# Patient Record
Sex: Female | Born: 2000 | Race: White | Hispanic: No | Marital: Single | Smoking: Never smoker
Health system: Southern US, Community
[De-identification: ages and names within clinical notes are randomized; demographics above are authoritative.]

## PROBLEM LIST (undated history)

## (undated) ENCOUNTER — Emergency Department (HOSPITAL_COMMUNITY): Payer: 59

## (undated) DIAGNOSIS — T50901A Poisoning by unspecified drugs, medicaments and biological substances, accidental (unintentional), initial encounter: Secondary | ICD-10-CM

## (undated) DIAGNOSIS — F32A Depression, unspecified: Secondary | ICD-10-CM

## (undated) DIAGNOSIS — F419 Anxiety disorder, unspecified: Secondary | ICD-10-CM

## (undated) HISTORY — PX: WISDOM TOOTH EXTRACTION: SHX21

## (undated) HISTORY — PX: APPENDECTOMY: SHX54

---

## 2020-04-20 ENCOUNTER — Other Ambulatory Visit: Payer: Self-pay

## 2020-04-20 ENCOUNTER — Emergency Department (HOSPITAL_COMMUNITY)
Admission: EM | Admit: 2020-04-20 | Discharge: 2020-04-21 | Disposition: A | Discharge status: Home / Self Care | Payer: 59 | Attending: Emergency Medicine | Admitting: Emergency Medicine

## 2020-04-20 ENCOUNTER — Emergency Department (HOSPITAL_COMMUNITY): Payer: 59

## 2020-04-20 ENCOUNTER — Encounter (HOSPITAL_COMMUNITY): Payer: Self-pay | Admitting: Emergency Medicine

## 2020-04-20 DIAGNOSIS — R05 Cough: Secondary | ICD-10-CM | POA: Insufficient documentation

## 2020-04-20 DIAGNOSIS — R1084 Generalized abdominal pain: Secondary | ICD-10-CM | POA: Diagnosis not present

## 2020-04-20 DIAGNOSIS — R000 Tachycardia, unspecified: Secondary | ICD-10-CM | POA: Diagnosis not present

## 2020-04-20 DIAGNOSIS — R509 Fever, unspecified: Secondary | ICD-10-CM | POA: Insufficient documentation

## 2020-04-20 DIAGNOSIS — Z20822 Contact with and (suspected) exposure to covid-19: Secondary | ICD-10-CM | POA: Insufficient documentation

## 2020-04-20 DIAGNOSIS — I880 Nonspecific mesenteric lymphadenitis: Secondary | ICD-10-CM

## 2020-04-20 LAB — COMPREHENSIVE METABOLIC PANEL
ALT: 59 U/L — ABNORMAL HIGH (ref 0–44)
AST: 56 U/L — ABNORMAL HIGH (ref 15–41)
Albumin: 4.4 g/dL (ref 3.5–5.0)
Alkaline Phosphatase: 70 U/L (ref 38–126)
Anion gap: 12 (ref 5–15)
BUN: 9 mg/dL (ref 6–20)
CO2: 24 mmol/L (ref 22–32)
Calcium: 9.5 mg/dL (ref 8.9–10.3)
Chloride: 102 mmol/L (ref 98–111)
Creatinine, Ser: 0.83 mg/dL (ref 0.44–1.00)
GFR calc Af Amer: >60 mL/min (ref >60)
GFR calc non Af Amer: >60 mL/min (ref >60)
Glucose, Bld: 101 mg/dL — ABNORMAL HIGH (ref 70–99)
Potassium: 3.3 mmol/L — ABNORMAL LOW (ref 3.5–5.1)
Sodium: 138 mmol/L (ref 135–145)
Total Bilirubin: 1.5 mg/dL — ABNORMAL HIGH (ref 0.3–1.2)
Total Protein: 7.3 g/dL (ref 6.5–8.1)

## 2020-04-20 LAB — CBC WITH DIFFERENTIAL/PLATELET
Abs Immature Granulocytes: 0.01 10*3/uL (ref 0.00–0.07)
Basophils Absolute: 0 10*3/uL (ref 0.0–0.1)
Basophils Relative: 1 %
Eosinophils Absolute: 0 10*3/uL (ref 0.0–0.5)
Eosinophils Relative: 0 %
HCT: 35.5 % — ABNORMAL LOW (ref 36.0–46.0)
Hemoglobin: 12.3 g/dL (ref 12.0–15.0)
Immature Granulocytes: 0 %
Lymphocytes Relative: 38 %
Lymphs Abs: 2.1 10*3/uL (ref 0.7–4.0)
MCH: 30.1 pg (ref 26.0–34.0)
MCHC: 34.6 g/dL (ref 30.0–36.0)
MCV: 86.8 fL (ref 80.0–100.0)
Monocytes Absolute: 0.4 10*3/uL (ref 0.1–1.0)
Monocytes Relative: 7 %
Neutro Abs: 3.1 10*3/uL (ref 1.7–7.7)
Neutrophils Relative %: 54 %
Platelets: 186 10*3/uL (ref 150–400)
RBC: 4.09 MIL/uL (ref 3.87–5.11)
RDW: 12.9 % (ref 11.5–15.5)
WBC: 5.7 10*3/uL (ref 4.0–10.5)
nRBC: 0 % (ref 0.0–0.2)

## 2020-04-20 LAB — I-STAT BETA HCG BLOOD, ED (MC, WL, AP ONLY): I-stat hCG, quantitative: <5 m[IU]/mL (ref <5)

## 2020-04-20 LAB — LACTIC ACID, PLASMA: Lactic Acid, Venous: 1 mmol/L (ref 0.5–1.9)

## 2020-04-20 MED ORDER — ACETAMINOPHEN 325 MG PO TABS
650.0000 mg | ORAL_TABLET | Freq: Once | ORAL | Status: AC
  Administered 2020-04-20: 650 mg via ORAL
  Filled 2020-04-20: qty 2

## 2020-04-20 MED ORDER — LACTATED RINGERS IV BOLUS (SEPSIS)
1000.0000 mL | Freq: Once | INTRAVENOUS | Status: AC
  Administered 2020-04-20: 1000 mL via INTRAVENOUS

## 2020-04-20 MED ORDER — METRONIDAZOLE IN NACL 5-0.79 MG/ML-% IV SOLN
500.0000 mg | Freq: Once | INTRAVENOUS | Status: AC
  Administered 2020-04-21: 500 mg via INTRAVENOUS
  Filled 2020-04-20: qty 100

## 2020-04-20 MED ORDER — LACTATED RINGERS IV BOLUS (SEPSIS)
250.0000 mL | Freq: Once | INTRAVENOUS | Status: AC
  Administered 2020-04-20: 250 mL via INTRAVENOUS

## 2020-04-20 MED ORDER — FENTANYL CITRATE (PF) 100 MCG/2ML IJ SOLN
50.0000 ug | Freq: Once | INTRAMUSCULAR | Status: AC
  Administered 2020-04-20: 50 ug via INTRAVENOUS
  Filled 2020-04-20: qty 2

## 2020-04-20 MED ORDER — LACTATED RINGERS IV SOLN: INTRAVENOUS | Status: DC

## 2020-04-20 MED ORDER — IOHEXOL 300 MG/ML  SOLN
100.0000 mL | Freq: Once | INTRAMUSCULAR | Status: AC | PRN
  Administered 2020-04-20: 100 mL via INTRAVENOUS

## 2020-04-20 MED ORDER — SODIUM CHLORIDE 0.9 % IV SOLN
2.0000 g | Freq: Once | INTRAVENOUS | Status: AC
  Administered 2020-04-20: 2 g via INTRAVENOUS
  Filled 2020-04-20: qty 20

## 2020-04-20 NOTE — ED Triage Notes (Signed)
Patient c/o fever, body aches, and abdominal pain x2 days.

## 2020-04-20 NOTE — ED Provider Notes (Signed)
[facility]-EMERGENCY DEPT Provider Note   CSN: [id number] Arrival date & time: 04/20/20  2218     History Chief Complaint  Patient presents with  . Abdominal Pain  . Fever    Cathy Garner is a 19 y.o. female.  The history is provided by the patient.  Abdominal Pain Pain location:  Generalized Pain quality: aching   Pain radiates to:  Does not radiate Pain severity:  Severe Onset quality:  Gradual Duration:  2 days Timing:  Constant Progression:  Worsening Chronicity:  New Relieved by:  None tried Worsened by:  Movement and palpation Associated symptoms: chills, cough and fever   Associated symptoms: no diarrhea, no dysuria, no vaginal bleeding, no vaginal discharge and no vomiting   Fever Associated symptoms: chills and cough   Associated symptoms: no diarrhea, no dysuria and no vomiting    Patient reports over the past 2 days she has had abdominal pain.  She reports that starting on her right side is now throughout her abdomen.  She reports fevers and body aches.  She has had mild cough.  She is a Archivist.  She is fully vaccinated for Covid She had a previous appendectomy     PMH- Past Surgical History:  Procedure Laterality Date  . APPENDECTOMY       OB History   No obstetric history on file.     No family history on file.  Social History   Tobacco Use  . Smoking status: Not on file  Substance Use Topics  . Alcohol use: Not on file  . Drug use: Not on file    Home Medications Prior to Admission medications   Not on File    Allergies    Patient has no known allergies.  Review of Systems   Review of Systems  Constitutional: Positive for chills and fever.  Respiratory: Positive for cough.   Gastrointestinal: Positive for abdominal pain. Negative for diarrhea and vomiting.  Genitourinary: Negative for dysuria, vaginal bleeding and vaginal discharge.  All other systems reviewed and are negative.   Physical  Exam Updated Vital Signs BP (!) 135/100 (BP Location: Left Arm)   Pulse (!) 135   Temp (!) 102.4 F (39.1 C) (Oral)   Resp 18   Ht 1.727 m (5\' 8" )   Wt 72.6 kg   LMP 04/06/2020   SpO2 100%   BMI 24.33 kg/m   Physical Exam CONSTITUTIONAL: Well developed/well nourished, uncomfortable appearing  HEAD: Normocephalic/atraumatic EYES: EOMI/PERRL ENMT: mask in place NECK: supple no meningeal signs SPINE/BACK:entire spine nontender CV: S1/S2 noted, no murmurs/rubs/gallops noted LUNGS: Lungs are clear to auscultation bilaterally, no apparent distress ABDOMEN: soft, diffuse moderate tenderness, no rebound or guarding, bowel sounds noted throughout abdomen GU:no cva tenderness NEURO: Pt is awake/alert/appropriate, moves all extremitiesx4.  No facial droop.   EXTREMITIES: pulses normal/equal, full ROM SKIN: warm, color normal PSYCH: no abnormalities of mood noted, alert and oriented to situation  ED Results / Procedures / Treatments   Labs (all labs ordered are listed, but only abnormal results are displayed) Labs Reviewed  CBC WITH DIFFERENTIAL/PLATELET - Abnormal; Notable for the following components:      Result Value   HCT 35.5 (*)    All other components within normal limits  SARS CORONAVIRUS 2 BY RT PCR (HOSPITAL ORDER, PERFORMED IN [facility])  CULTURE, BLOOD (ROUTINE X 2)  CULTURE, BLOOD (ROUTINE X 2)  URINE CULTURE  LACTIC ACID, PLASMA  LACTIC ACID, PLASMA  COMPREHENSIVE METABOLIC PANEL  URINALYSIS, ROUTINE W REFLEX MICROSCOPIC  I-STAT BETA HCG BLOOD, ED ([facility], [facility], [facility] ONLY)    EKG EKG Interpretation  Date/Time:  Tuesday April 20 2020 23:25:01 EDT Ventricular Rate:  118 PR Interval:    QRS Duration: 85 QT Interval:  311 QTC Calculation: 436 R Axis:   82 Text Interpretation: Sinus tachycardia Probable left atrial enlargement No previous ECGs available Confirmed by Zadie Rhine ([id number]) on 04/20/2020 11:30:11 PM   Radiology DG Chest Port 1  View  Result Date: 04/20/2020 CLINICAL DATA:  Fever, weakness and body aches. EXAM: PORTABLE CHEST 1 VIEW COMPARISON:  None. FINDINGS: The heart size and mediastinal contours are within normal limits. Both lungs are clear. The visualized skeletal structures are unremarkable. IMPRESSION: No active disease. Electronically Signed   By: Aram Candela M.D.   On: 04/20/2020 23:28    Procedures .Critical Care Performed by: Zadie Rhine, MD Authorized by: Zadie Rhine, MD   Critical care provider statement:    Critical care time (minutes):  35   Critical care start time:  04/20/2020 10:55 PM   Critical care end time:  04/20/2020 10:30 PM   Critical care time was exclusive of:  Separately billable procedures and treating other patients   Critical care was necessary to treat or prevent imminent or life-threatening deterioration of the following conditions:  Dehydration and sepsis   Critical care was time spent personally by me on the following activities:  Development of treatment plan with patient or surrogate, evaluation of patient's response to treatment, examination of patient, pulse oximetry, ordering and review of radiographic studies, ordering and review of laboratory studies, re-evaluation of patient's condition and ordering and performing treatments and interventions   I assumed direction of critical care for this patient from another provider in my specialty: no      Medications Ordered in ED Medications  lactated ringers infusion (has no administration in time range)  lactated ringers bolus 1,000 mL (has no administration in time range)    And  lactated ringers bolus 1,000 mL (has no administration in time range)    And  lactated ringers bolus 250 mL (has no administration in time range)  cefTRIAXone (ROCEPHIN) 2 g in sodium chloride 0.9 % 100 mL IVPB (has no administration in time range)  metroNIDAZOLE (FLAGYL) IVPB 500 mg (has no administration in time range)  fentaNYL  (SUBLIMAZE) injection 50 mcg (has no administration in time range)  acetaminophen (TYLENOL) tablet 650 mg (has no administration in time range)    ED Course  I have reviewed the triage vital signs and the nursing notes.  Pertinent labs & imaging results that were available during my care of the patient were reviewed by me and considered in my medical decision making (see chart for details).    MDM Rules/Calculators/A&P                          11:15 PM Presents with fever/abdominal pain for 2 days.  Patient is ill-appearing.  Code sepsis has been called.  Her request I have contacted her mother and her college friend and informed them of the plan.  We will follow closely 11:38 PM CXR reviewed - this was negative Pt receiving IV fluids She reports mild improvement Will proceed with CT imaging abd/pelvis due to concern for sepsis IV antibiotics have been ordered At signout to Dr. Eudelia Bunch, f/u on CT imaging  Final Clinical Impression(s) / ED Diagnoses Final diagnoses:  Generalized abdominal pain  Tachycardia    Rx / DC Orders ED Discharge Orders    None       Zadie Rhine, MD 04/20/20 2339

## 2020-04-21 ENCOUNTER — Emergency Department (HOSPITAL_COMMUNITY): Payer: 59

## 2020-04-21 LAB — URINALYSIS, ROUTINE W REFLEX MICROSCOPIC
Bilirubin Urine: NEGATIVE
Glucose, UA: NEGATIVE mg/dL
Hgb urine dipstick: NEGATIVE
Ketones, ur: 5 mg/dL — AB
Leukocytes,Ua: NEGATIVE
Nitrite: NEGATIVE
Protein, ur: NEGATIVE mg/dL
Specific Gravity, Urine: 1.032 — ABNORMAL HIGH (ref 1.005–1.030)
pH: 5 (ref 5.0–8.0)

## 2020-04-21 LAB — SARS CORONAVIRUS 2 BY RT PCR (HOSPITAL ORDER, PERFORMED IN ~~LOC~~ HOSPITAL LAB): SARS Coronavirus 2: NEGATIVE

## 2020-04-21 LAB — URINE CULTURE: Culture: NO GROWTH

## 2020-04-21 NOTE — ED Provider Notes (Signed)
I assumed care of this patient.  Please see previous provider note for further details of Hx, PE.  Briefly patient is a 19 y.o. female who presented fever and abdominal pain.  Labs grossly reassuring.  Patient pending CT of the abdomen.    CT notable for shotty mesenteric lymphadenopathy with mild splenomegaly.  Given fever likely infectious.  Patient denies any throat pain related to possible mono.  He does however have mild transaminitis.  No pharyngitis on exam.  Informed of the possibility of her having mononucleosis. COVID negative.  Also informed that the other possible differential diagnoses.  Patient symptoms improved after treatment here and she was able to tolerate oral intake.  The patient appears reasonably screened and/or stabilized for discharge and I doubt any other medical condition or other [facility] requiring further screening, evaluation, or treatment in the ED at this time prior to discharge. Safe for discharge with strict return precautions.  Disposition: Discharge  Condition: Good  I have discussed the results, Dx and Tx plan with the patient/family who expressed understanding and agree(s) with the plan. Discharge instructions discussed at length. The patient/family was given strict return precautions who verbalized understanding of the instructions. No further questions at time of discharge.    ED Discharge Orders    None       Follow Up: [facility]  Go to  As needed      Nira Conn, MD 04/21/20 [phone]

## 2020-04-21 NOTE — ED Notes (Signed)
Patient transported to CT 

## 2020-04-21 NOTE — ED Notes (Signed)
Pt ambulatory to restroom w/out assistance.  

## 2020-04-23 ENCOUNTER — Ambulatory Visit
Admission: EM | Admit: 2020-04-23 | Discharge: 2020-04-23 | Disposition: A | Discharge status: Home / Self Care | Payer: 59

## 2020-04-26 LAB — CULTURE, BLOOD (ROUTINE X 2)
Culture: NO GROWTH
Culture: NO GROWTH
Special Requests: ADEQUATE
Special Requests: ADEQUATE

## 2020-09-20 ENCOUNTER — Other Ambulatory Visit: Payer: Self-pay

## 2020-09-20 DIAGNOSIS — T43201A Poisoning by unspecified antidepressants, accidental (unintentional), initial encounter: Secondary | ICD-10-CM | POA: Insufficient documentation

## 2020-09-20 DIAGNOSIS — R45851 Suicidal ideations: Secondary | ICD-10-CM | POA: Diagnosis not present

## 2020-09-20 DIAGNOSIS — F332 Major depressive disorder, recurrent severe without psychotic features: Secondary | ICD-10-CM | POA: Insufficient documentation

## 2020-09-20 DIAGNOSIS — Z20822 Contact with and (suspected) exposure to covid-19: Secondary | ICD-10-CM | POA: Insufficient documentation

## 2020-09-20 DIAGNOSIS — Z046 Encounter for general psychiatric examination, requested by authority: Secondary | ICD-10-CM | POA: Diagnosis present

## 2020-09-21 ENCOUNTER — Emergency Department
Admission: EM | Admit: 2020-09-21 | Discharge: 2020-09-22 | Disposition: A | Discharge status: Other Acute Inpatient Hospital | Payer: 59 | Attending: Emergency Medicine | Admitting: Emergency Medicine

## 2020-09-21 ENCOUNTER — Encounter: Payer: Self-pay | Admitting: Emergency Medicine

## 2020-09-21 DIAGNOSIS — F332 Major depressive disorder, recurrent severe without psychotic features: Secondary | ICD-10-CM | POA: Diagnosis present

## 2020-09-21 DIAGNOSIS — T43211A Poisoning by selective serotonin and norepinephrine reuptake inhibitors, accidental (unintentional), initial encounter: Secondary | ICD-10-CM

## 2020-09-21 DIAGNOSIS — R45851 Suicidal ideations: Secondary | ICD-10-CM

## 2020-09-21 DIAGNOSIS — T43201A Poisoning by unspecified antidepressants, accidental (unintentional), initial encounter: Secondary | ICD-10-CM

## 2020-09-21 HISTORY — DX: Anxiety disorder, unspecified: F41.9

## 2020-09-21 HISTORY — DX: Depression, unspecified: F32.A

## 2020-09-21 LAB — CBC
HCT: 36 % (ref 36.0–46.0)
Hemoglobin: 12.1 g/dL (ref 12.0–15.0)
MCH: 29.2 pg (ref 26.0–34.0)
MCHC: 33.6 g/dL (ref 30.0–36.0)
MCV: 86.7 fL (ref 80.0–100.0)
Platelets: 226 10*3/uL (ref 150–400)
RBC: 4.15 MIL/uL (ref 3.87–5.11)
RDW: 13.1 % (ref 11.5–15.5)
WBC: 6.1 10*3/uL (ref 4.0–10.5)
nRBC: 0 % (ref 0.0–0.2)

## 2020-09-21 LAB — COMPREHENSIVE METABOLIC PANEL
ALT: 18 U/L (ref 0–44)
AST: 16 U/L (ref 15–41)
Albumin: 4.4 g/dL (ref 3.5–5.0)
Alkaline Phosphatase: 57 U/L (ref 38–126)
Anion gap: 7 (ref 5–15)
BUN: 9 mg/dL (ref 6–20)
CO2: 22 mmol/L (ref 22–32)
Calcium: 9.3 mg/dL (ref 8.9–10.3)
Chloride: 108 mmol/L (ref 98–111)
Creatinine, Ser: 0.59 mg/dL (ref 0.44–1.00)
GFR, Estimated: >60 mL/min (ref >60)
Glucose, Bld: 113 mg/dL — ABNORMAL HIGH (ref 70–99)
Potassium: 3.4 mmol/L — ABNORMAL LOW (ref 3.5–5.1)
Sodium: 137 mmol/L (ref 135–145)
Total Bilirubin: 0.9 mg/dL (ref 0.3–1.2)
Total Protein: 7.2 g/dL (ref 6.5–8.1)

## 2020-09-21 LAB — RESP PANEL BY RT-PCR (FLU A&B, COVID) ARPGX2
Influenza A by PCR: NEGATIVE
Influenza B by PCR: NEGATIVE
SARS Coronavirus 2 by RT PCR: NEGATIVE

## 2020-09-21 LAB — ETHANOL: Alcohol, Ethyl (B): <10 mg/dL (ref <10)

## 2020-09-21 LAB — URINE DRUG SCREEN, QUALITATIVE (ARMC ONLY)
Amphetamines, Ur Screen: NOT DETECTED
Barbiturates, Ur Screen: NOT DETECTED
Benzodiazepine, Ur Scrn: NOT DETECTED
Cannabinoid 50 Ng, Ur ~~LOC~~: POSITIVE — AB
Cocaine Metabolite,Ur ~~LOC~~: NOT DETECTED
MDMA (Ecstasy)Ur Screen: NOT DETECTED
Methadone Scn, Ur: NOT DETECTED
Opiate, Ur Screen: NOT DETECTED
Phencyclidine (PCP) Ur S: NOT DETECTED
Tricyclic, Ur Screen: NOT DETECTED

## 2020-09-21 LAB — ACETAMINOPHEN LEVEL: Acetaminophen (Tylenol), Serum: <10 ug/mL — ABNORMAL LOW (ref 10–30)

## 2020-09-21 LAB — SALICYLATE LEVEL: Salicylate Lvl: <7 mg/dL — ABNORMAL LOW (ref 7.0–30.0)

## 2020-09-21 LAB — POC URINE PREG, ED: Preg Test, Ur: NEGATIVE

## 2020-09-21 MED ORDER — TRAZODONE HCL 50 MG PO TABS: 50.0000 mg | ORAL_TABLET | Freq: Every evening | ORAL | Status: DC | PRN

## 2020-09-21 MED ORDER — FLUOXETINE HCL 20 MG PO CAPS
40.0000 mg | ORAL_CAPSULE | Freq: Every day | ORAL | Status: DC
  Administered 2020-09-21: 40 mg via ORAL
  Filled 2020-09-21: qty 2

## 2020-09-21 MED ORDER — IBUPROFEN 600 MG PO TABS
600.0000 mg | ORAL_TABLET | Freq: Once | ORAL | Status: AC
  Administered 2020-09-21: 600 mg via ORAL
  Filled 2020-09-21: qty 1

## 2020-09-21 NOTE — ED Notes (Signed)
Hourly rounding completed at this time, patient currently awake in room. No complaints, stable, and in no acute distress. Q15 minute rounds and monitoring via Security Cameras to continue. 

## 2020-09-21 NOTE — Consult Note (Signed)
[facility] Face-to-Face Psychiatry Consult   Reason for Consult: Ingestion and Suicidal Referring Physician: Dr. Katrinka Blazing Patient Identification: Cathy Garner MRN:  [id number] Principal Diagnosis: <principal problem not specified> Diagnosis:  Active Problems:   MDD (major depressive disorder), recurrent episode, severe (HCC)   Total Time spent with patient: 30 minutes  Subjective: "I became overwhelmed and took about 5-6 Trazadone."  [name] is a 20 y.o. female patient presented to [facility] ED under involuntary commitment status (IVC). Per the ED triage nurse note, Pt reports taking approximately 5 Trazodone since 2130. When asked if this was to harm self, pt responds "yes." Pt reports she is still actively having SI thoughts. Pt with hx/o mental health disorders and in-patient placement. Pt is calm and cooperative in triage. The Pt reports taking approximately 5 Trazodone since 2130. When asked if this was to harm self, pt responds "yes." Pt reports she is still actively having SI thoughts. Pt with hx/o mental health disorders and in-patient placement. The patient is calm and cooperative in triage.   The patient was seen face-to-face by this provider; the chart was reviewed and consulted with Dr. Katrinka Blazing on 09/21/2020 due to the patient's care. It was discussed with the EDP that the patient does meet the criteria to be admitted to the psychiatric inpatient unit.  The patient continued to be matter-of-fact about her action tonight. The patient stated her behavior tonight partially stems from her taking on other people's problems. The patient disclosed she is from [place] and her parents are aware of what happened tonight (02.22.22). 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 The patient admits to having a therapist, and her primary care prescribes her medication. She reports being prescribed Prozac 40 mg daily and has taken this medication for almost a year. The patient voiced  that she does not think the medication has been as effective as she hoped.  The patient discussed she had been hospitalized once before today's visit. She narrated it was last spring, and she had suicidal ideation. She expressed checking herself into the hospital before she would do something worse. She stated she was in the hospital for a week and then partial hospitalization for three weeks. On evaluation, the patient is alert and oriented x 4, calm and cooperative, and mood-congruent with affect. The patient does not appear to be responding to internal or external stimuli. Neither is the patient presenting with any delusional thinking. The patient denies auditory or visual hallucinations. The patient denies any suicidal, homicidal, or self-harm ideations. The patient is not presenting with any psychotic or paranoid behaviors. During an encounter with the patient, she answered questions appropriately.   HPI: per Dr. Katrinka Blazing, Cathy Garner is a 20 y.o. femalewho presents to the ED for evaluation of suicidality and possible overdose. Chart review indicates history of anxiety and depression. Patient is a Consulting civil engineer at [employer].  She denies any recent illnesses.  She reports being prescribed SSRI and trazodone. Patient reports psychosocial stressors precipitating acute suicidality and depression causing her to take 5-6 tablets of her prescribed trazodone 50 mg tablets.  She reports taking extra tablets in order "to fall asleep forever" and with the intent of suicide.  She reports this was at 9:30 PM on 2/21.  She denies taking any additional prescription or recreational drugs this evening beyond the trazodone. She reports continued desire for suicide.  Denies homicidality or hallucinations at this time.   Past Psychiatric History:  Anxiety Depression  Risk to Self:  Yes Risk to Others:  No Prior Inpatient Therapy:  Yes Prior Outpatient Therapy:  Yes  Past Medical History:  Past Medical  History:  Diagnosis Date  . Anxiety   . Depression     Past Surgical History:  Procedure Laterality Date  . APPENDECTOMY    . WISDOM TOOTH EXTRACTION     Family History: History reviewed. No pertinent family history. Family Psychiatric  History:  Social History:  Social History   Substance and Sexual Activity  Alcohol Use Yes   Comment: Occasional Drinker     Social History   Substance and Sexual Activity  Drug Use Never    Social History   Socioeconomic History  . Marital status: Single    Spouse name: Not on file  . Number of children: Not on file  . Years of education: Not on file  . Highest education level: Not on file  Occupational History  . Not on file  Tobacco Use  . Smoking status: Never Smoker  . Smokeless tobacco: Never Used  Substance and Sexual Activity  . Alcohol use: Yes    Comment: Occasional Drinker  . Drug use: Never  . Sexual activity: Not on file  Other Topics Concern  . Not on file  Social History Narrative  . Not on file   Social Determinants of Health   Financial Resource Strain: Not on file  Food Insecurity: Not on file  Transportation Needs: Not on file  Physical Activity: Not on file  Stress: Not on file  Social Connections: Not on file   Additional Social History:    Allergies:   Allergies  Allergen Reactions  . Red Dye Itching    Labs:  Results for orders placed or performed during the hospital encounter of 09/21/20 (from the past 48 hour(s))  Comprehensive metabolic panel     Status: Abnormal   Collection Time: 09/21/20 12:00 AM  Result Value Ref Range   Sodium 137 135 - 145 mmol/L   Potassium 3.4 (L) 3.5 - 5.1 mmol/L   Chloride 108 98 - 111 mmol/L   CO2 22 22 - 32 mmol/L   Glucose, Bld 113 (H) 70 - 99 mg/dL    Comment: Glucose reference range applies only to samples taken after fasting for at least 8 hours.   BUN 9 6 - 20 mg/dL   Creatinine, Ser 6.26 0.44 - 1.00 mg/dL   Calcium 9.3 8.9 - 94.8 mg/dL   Total  Protein 7.2 6.5 - 8.1 g/dL   Albumin 4.4 3.5 - 5.0 g/dL   AST 16 15 - 41 U/L   ALT 18 0 - 44 U/L   Alkaline Phosphatase 57 38 - 126 U/L   Total Bilirubin 0.9 0.3 - 1.2 mg/dL   GFR, Estimated >54 >62 mL/min    Comment: (NOTE) Calculated using the CKD-EPI Creatinine Equation (2021)    Anion gap 7 5 - 15    Comment: Performed at [facility] [place] [facility], [address] [place], [place] [address]  Ethanol     Status: None   Collection Time: 09/21/20 12:00 AM  Result Value Ref Range   Alcohol, Ethyl (B) <10 <10 mg/dL    Comment: (NOTE) Lowest detectable limit for serum alcohol is 10 mg/dL.  For medical purposes only. Performed at [facility], [address] [place], [place] [address]   Salicylate level     Status: Abnormal   Collection Time: 09/21/20 12:00 AM  Result Value Ref Range   Salicylate Lvl <7.0 (  L) 7.0 - 30.0 mg/dL    Comment: Performed at [facility], [address] [place], [place] [address]  Acetaminophen level     Status: Abnormal   Collection Time: 09/21/20 12:00 AM  Result Value Ref Range   Acetaminophen (Tylenol), Serum <10 (L) 10 - 30 ug/mL    Comment: (NOTE) Therapeutic concentrations vary significantly. A range of 10-30 ug/mL  may be an effective concentration for many patients. However, some  are best treated at concentrations outside of this range. Acetaminophen concentrations >150 ug/mL at 4 hours after ingestion  and >50 ug/mL at 12 hours after ingestion are often associated with  toxic reactions.  Performed at [facility], [address] [place], [place] [address]   cbc     Status: None   Collection Time: 09/21/20 12:00 AM  Result Value Ref Range   WBC 6.1 4.0 - 10.5 K/uL   RBC 4.15 3.87 - 5.11 MIL/uL   Hemoglobin 12.1 12.0 - 15.0 g/dL   HCT 09.836.0 11.936.0 - 14.746.0 %   MCV 86.7 80.0 - 100.0 fL   MCH 29.2 26.0 - 34.0 pg   MCHC 33.6 30.0 - 36.0 g/dL   RDW 82.913.1 56.211.5 - 13.015.5 %   Platelets 226 150 - 400 K/uL   nRBC 0.0  0.0 - 0.2 %    Comment: Performed at [facility], [address] [place], [place] [address]  Urine Drug Screen, Qualitative     Status: Abnormal   Collection Time: 09/21/20 12:00 AM  Result Value Ref Range   Tricyclic, Ur Screen NONE DETECTED NONE DETECTED   Amphetamines, Ur Screen NONE DETECTED NONE DETECTED   MDMA (Ecstasy)Ur Screen NONE DETECTED NONE DETECTED   Cocaine Metabolite,Ur [place] NONE DETECTED NONE DETECTED   Opiate, Ur Screen NONE DETECTED NONE DETECTED   Phencyclidine (PCP) Ur S NONE DETECTED NONE DETECTED   Cannabinoid 50 Ng, Ur [place] POSITIVE (A) NONE DETECTED   Barbiturates, Ur Screen NONE DETECTED NONE DETECTED   Benzodiazepine, Ur Scrn NONE DETECTED NONE DETECTED   Methadone Scn, Ur NONE DETECTED NONE DETECTED    Comment: (NOTE) Tricyclics + metabolites, urine    Cutoff 1000 ng/mL Amphetamines + metabolites, urine  Cutoff 1000 ng/mL MDMA (Ecstasy), urine              Cutoff 500 ng/mL Cocaine Metabolite, urine          Cutoff 300 ng/mL Opiate + metabolites, urine        Cutoff 300 ng/mL Phencyclidine (PCP), urine         Cutoff 25 ng/mL Cannabinoid, urine                 Cutoff 50 ng/mL Barbiturates + metabolites, urine  Cutoff 200 ng/mL Benzodiazepine, urine              Cutoff 200 ng/mL Methadone, urine                   Cutoff 300 ng/mL  The urine drug screen provides only a preliminary, unconfirmed analytical test result and should not be used for non-medical purposes. Clinical consideration and professional judgment should be applied to any positive drug screen result due to possible interfering substances. A more specific alternate chemical method must be used in order to obtain a confirmed analytical result. Gas chromatography / mass spectrometry (GC/MS) is the preferred confirm atory method. Performed at [facility], [address] [place], [place] [address]   POC urine  preg, ED     Status: None   Collection Time: 09/21/20 12:06 AM   Result Value Ref Range   Preg Test, Ur NEGATIVE NEGATIVE    Comment:        THE SENSITIVITY OF THIS METHODOLOGY IS >24 mIU/mL     No current facility-administered medications for this encounter.   Current Outpatient Medications  Medication Sig Dispense Refill  . acetaminophen (TYLENOL) 325 MG tablet Take 650 mg by mouth every 6 (six) hours as needed for mild pain, moderate pain, fever or headache.    Marland Kitchen FLUoxetine (PROZAC) 40 MG capsule Take 40 mg by mouth daily.    . traZODone (DESYREL) 50 MG tablet Take 75 mg by mouth at bedtime as needed for sleep.     Marland Kitchen triamcinolone (KENALOG) 0.025 % ointment Apply 1 application topically 2 (two) times daily as needed. Itching      Musculoskeletal: Strength & Muscle Tone: within normal limits Gait & Station: normal Patient leans: N/A  Psychiatric Specialty Exam: Physical Exam Vitals and nursing note reviewed.  Constitutional:      Appearance: Normal appearance.  HENT:     Right Ear: External ear normal.     Left Ear: External ear normal.  Cardiovascular:     Rate and Rhythm: Normal rate.     Pulses: Normal pulses.  Pulmonary:     Effort: Pulmonary effort is normal.  Musculoskeletal:        General: Normal range of motion.     Cervical back: Normal range of motion and neck supple.  Neurological:     General: No focal deficit present.     Mental Status: She is alert and oriented to person, place, and time. Mental status is at baseline.  Psychiatric:        Attention and Perception: Attention and perception normal.        Mood and Affect: Affect normal. Mood is anxious and depressed.        Speech: Speech normal.        Behavior: Behavior is withdrawn. Behavior is cooperative.        Thought Content: Thought content includes suicidal ideation.        Cognition and Memory: Cognition and memory normal.        Judgment: Judgment normal.     Review of Systems  Psychiatric/Behavioral: Positive for suicidal ideas. The patient is  nervous/anxious.   All other systems reviewed and are negative.   Blood pressure 120/79, pulse 86, temperature 98.3 F (36.8 C), temperature source Oral, resp. rate 18, SpO2 98 %.There is no height or weight on file to calculate BMI.  General Appearance: Casual and matter of fact about her SI attempt  Eye Contact:  Good  Speech:  Clear and Coherent  Volume:  Normal  Mood:  Anxious, Depressed and Hopeless  Affect:  Appropriate, Congruent, Depressed and Inappropriate  Thought Process:  Coherent, Goal Directed and Irrelevant  Orientation:  Full (Time, Place, and Person)  Thought Content:  WDL and Logical  Suicidal Thoughts:  Yes.  with intent/plan  Homicidal Thoughts:  No  Memory:  Immediate;   Good Recent;   Good Remote;   Good  Judgement:  Good  Insight:  Good  Psychomotor Activity:  Normal, Decreased and Restlessness  Concentration:  Concentration: Good and Attention Span: Good  Recall:  Good  Fund of Knowledge:  Good  Language:  Good  Akathisia:  Negative  Handed:  Right  AIMS (if indicated):  Assets:  Communication Skills Desire for Improvement Financial Resources/Insurance Leisure Time Resilience Social Support  ADL's:  Intact  Cognition:  WNL  Sleep:    Well     Treatment Plan Summary: Plan The patient is a safety risk to herself and requires psychiatric inpatient admission for stabilization and treatment.   Disposition: Recommend psychiatric Inpatient admission when medically cleared. Supportive therapy provided about ongoing stressors. The patient is a safety risk to herself and requires psychiatric inpatient admission for stabilization and treatment.  Gillermo Murdoch, NP 09/21/2020 3:51 AM

## 2020-09-21 NOTE — ED Notes (Signed)
Pt c/o menstrual cramping at this time. Requesting a pad and ibuprofen

## 2020-09-21 NOTE — ED Triage Notes (Signed)
Pt reports taking approximately 5 Trazodone since 2130. When asked if this was to harm self, pt responds "yes." Pt reports she is still actively having SI thoughts. Pt with hx/o mental health disorders and in-patient placement. Pt is calm and cooperative in triage.

## 2020-09-21 NOTE — ED Notes (Signed)
Mom updated with pt permission

## 2020-09-21 NOTE — ED Provider Notes (Signed)
[facility] Emergency Department Provider Note ____________________________________________   Event Date/Time   First MD Initiated Contact with Patient 09/21/20 0023     (approximate)  I have reviewed the triage vital signs and the nursing notes.  HISTORY  Chief Complaint Ingestion and Suicidal   HPI Cathy Garner is a 20 y.o. femalewho presents to the ED for evaluation of suicidality and possible overdose.  Chart review indicates history of anxiety and depression. Patient is a Consulting civil engineer at [employer].  She denies any recent illnesses.  She reports being prescribed SSRI and trazodone.  Patient reports psychosocial stressors precipitating acute suicidality and depression causing her to take 5-6 tablets of her prescribed trazodone 50 mg tablets.  She reports taking extra tablets in order "to fall asleep forever" and with the intent of suicide.  She reports this was at 9:30 PM on 2/21.  She denies taking any additional prescription or recreational drugs this evening beyond the trazodone.  She reports continued desire for suicide.  Denies homicidality or hallucinations at this time.   Past Medical History:  Diagnosis Date  . Anxiety   . Depression     There are no problems to display for this patient.   Past Surgical History:  Procedure Laterality Date  . APPENDECTOMY    . WISDOM TOOTH EXTRACTION      Prior to Admission medications   Medication Sig Start Date End Date Taking? Authorizing Provider  acetaminophen (TYLENOL) 325 MG tablet Take 650 mg by mouth every 6 (six) hours as needed for mild pain, moderate pain, fever or headache.    [provider]  FLUoxetine (PROZAC) 40 MG capsule Take 40 mg by mouth daily.    [provider]  traZODone (DESYREL) 50 MG tablet Take 75 mg by mouth at bedtime as needed for sleep.  11/14/19   [provider]  triamcinolone (KENALOG) 0.025 % ointment Apply 1 application topically 2  (two) times daily as needed. Itching    [provider]    Allergies Red dye  History reviewed. No pertinent family history.  Social History Social History   Tobacco Use  . Smoking status: Never Smoker  . Smokeless tobacco: Never Used  Substance Use Topics  . Alcohol use: Yes    Comment: Occasional Drinker  . Drug use: Never    Review of Systems  Constitutional: No fever/chills Eyes: No visual changes. ENT: No sore throat. Cardiovascular: Denies chest pain. Respiratory: Denies shortness of breath. Gastrointestinal: No abdominal pain.  No nausea, no vomiting.  No diarrhea.  No constipation. Genitourinary: Negative for dysuria. Musculoskeletal: Negative for back pain. Skin: Negative for rash. Neurological: Negative for headaches, focal weakness or numbness. Psychiatric: Positive for suicidality.  ____________________________________________   PHYSICAL EXAM:  VITAL SIGNS: Vitals:   09/20/20 2347  BP: 120/79  Pulse: 86  Resp: 18  Temp: 98.3 F (36.8 C)  SpO2: 98%     Constitutional: Alert and oriented. Well appearing and in no acute distress. Eyes: Conjunctivae are normal. PERRL. EOMI. Head: Atraumatic. Nose: No congestion/rhinnorhea. Mouth/Throat: Mucous membranes are moist.  Oropharynx non-erythematous. Neck: No stridor. No cervical spine tenderness to palpation. Cardiovascular: Normal rate, regular rhythm. Grossly normal heart sounds.  Good peripheral circulation. Respiratory: Normal respiratory effort.  No retractions. Lungs CTAB. Gastrointestinal: Soft , nondistended, nontender to palpation. No CVA tenderness. Musculoskeletal: No lower extremity tenderness nor edema.  No joint effusions. No signs of acute trauma. Neurologic:  Normal speech and language. No gross focal neurologic deficits  are appreciated. No gait instability noted. Skin:  Skin is warm, dry and intact. No rash noted. Psychiatric: Flat affect, but with linear thought  processes.  ____________________________________________   LABS (all labs ordered are listed, but only abnormal results are displayed)  Labs Reviewed  COMPREHENSIVE METABOLIC PANEL - Abnormal; Notable for the following components:      Result Value   Potassium 3.4 (*)    Glucose, Bld 113 (*)    All other components within normal limits  SALICYLATE LEVEL - Abnormal; Notable for the following components:   Salicylate Lvl <7.0 (*)    All other components within normal limits  ACETAMINOPHEN LEVEL - Abnormal; Notable for the following components:   Acetaminophen (Tylenol), Serum <10 (*)    All other components within normal limits  URINE DRUG SCREEN, QUALITATIVE ([facility] ONLY) - Abnormal; Notable for the following components:   Cannabinoid 50 Ng, Ur [place] POSITIVE (*)    All other components within normal limits  ETHANOL  CBC  POC URINE PREG, ED   ____________________________________________  12 Lead EKG  Sinus rhythm, rate of 78 bpm, rightward axis, normal intervals.  No evidence of acute ischemia.  QTC 446 ms. ____________________________________________   PROCEDURES and INTERVENTIONS  Procedure(s) performed (including Critical Care):  Procedures  Medications - No data to display  ____________________________________________   MDM / ED COURSE   Otherwise healthy 20 year old woman presents to the ED after small intentional overdose of trazodone with continued suicidality, requiring IVC and psychiatric consultation.  Normal vitals on room air.  Exam is reassuring without evidence of neurovascular deficits, trauma or any distress.  She is not somnolent and has no stigmata of any toxidromes.  Blood work is reassuring.  EKG is similarly reassuring with normal intervals.  No evidence of toxicity or medical pathology to preclude psychiatric consultation or disposition.  We will IVC the patient due to her continued suicidality and attempt at overdose.   Clinical Course as of 09/21/20  3244  Tue Sep 21, 2020  0131 The patient has been placed in psychiatric observation due to the need to provide a safe environment for the patient while obtaining psychiatric consultation and evaluation, as well as ongoing medical and medication management to treat the patient's condition. The patient has been placed under full IVC at this time.   [DS]    Clinical Course User Index [DS] Delton Prairie, MD    ____________________________________________   FINAL CLINICAL IMPRESSION(S) / ED DIAGNOSES  Final diagnoses:  Suicidal ideation  Overdose of trazodone     ED Discharge Orders    None       Alisia Vanengen Katrinka Blazing   Note:  This document was prepared using Dragon voice recognition software and may include unintentional dictation errors.   Delton Prairie, MD 09/21/20 [phone]

## 2020-09-21 NOTE — ED Notes (Signed)
Per Dr. Katrinka Blazing, pt is medically cleared.

## 2020-09-21 NOTE — ED Notes (Signed)
Pt presents to the ED for an intentional overdose. Pt took too many of her prescribed trazodone pt states she took like 5-6 pills. Pt states she has always had issues with anxiety, depression, and SI. This is not the first time she has had these thoughts pt states she had to be admitted inpatient before last April. Denies no new stressors that may have caused this. Pt is A&Ox4 and NAD. Pt is calm and cooperative at this time.

## 2020-09-21 NOTE — ED Notes (Signed)
Pt. To BHU from ED ambulatory without difficulty, to room  BHU 5. Report from [facility]. Pt. Is alert and oriented, warm and dry in no distress. Pt. Denies SI, HI, and AVH. Pt. Calm and cooperative. Pt. Made aware of security cameras and Q15 minute rounds. Pt. Encouraged to let Nursing staff know of any concerns or needs.   ENVIRONMENTAL ASSESSMENT Potentially harmful objects out of patient reach: Yes.   Personal belongings secured: Yes.   Patient dressed in hospital provided attire only: Yes.   Plastic bags out of patient reach: Yes.   Patient care equipment (cords, cables, call bells, lines, and drains) shortened, removed, or accounted for: Yes.   Equipment and supplies removed from bottom of stretcher: Yes.   Potentially toxic materials out of patient reach: Yes.   Sharps container removed or out of patient reach: Yes.

## 2020-09-21 NOTE — ED Notes (Signed)
Report received from Jessica, RN including  Situation, Background, Assessment, and Recommendations. Patient alert and oriented, warm and dry, in no acute distress. Patient denies SI, HI, AVH and pain. Patient made aware of Q15 minute rounds and security cameras for their safety. Patient instructed to come to this nurse with needs or concerns. 

## 2020-09-21 NOTE — Consult Note (Signed)
[facility] Face-to-Face Psychiatry Consult   Reason for Consult: Consult for 20 year old woman came into the hospital after taking an overdose of prescription medicine Referring Physician: Fuller Plan Patient Identification: Cathy Garner MRN:  [id number] Principal Diagnosis: MDD (major depressive disorder), recurrent episode, severe (HCC) Diagnosis:  Principal Problem:   MDD (major depressive disorder), recurrent episode, severe (HCC) Active Problems:   Overdose of antidepressant   Total Time spent with patient: 1 hour  Subjective:   Cathy Garner is a 20 y.o. female patient admitted with "I have not been feeling so good".  HPI: Patient seen chart reviewed.  Patient reports that she took an overdose of trazodone.  By her report she thinks she took only about 300 mg total when her usual prescribed dose is 50 mg.  She said she had not been sleeping recently.  She does not however deny that there was some suicidal ideation.  Apparently immediately afterwards however she contacted her friend who brought her to the hospital.  Patient says that she has been feeling very stressed out recently.  School has been stressful and anxiety provoking for her.  She denies psychotic symptoms.  Denies homicidal ideation.  States that she has been compliant with her prescription medicine of fluoxetine and trazodone.  She denies substance abuse although the drug screen is positive for cannabis.  Past Psychiatric History: Patient had a previous hospitalization near her home in [place] last April.  It was at that time that she was treated with antidepressant medicines for anxiety and depression.  No previous suicide attempts.  She does state that she has had suicidal ideation many times in the past.  Risk to Self:   Risk to Others:   Prior Inpatient Therapy:   Prior Outpatient Therapy:    Past Medical History:  Past Medical History:  Diagnosis Date  . Anxiety   . Depression     Past Surgical History:   Procedure Laterality Date  . APPENDECTOMY    . WISDOM TOOTH EXTRACTION     Family History: History reviewed. No pertinent family history. Family Psychiatric  History: She believes there is a significant family history of depression but does not know of any family history of suicide Social History:  Social History   Substance and Sexual Activity  Alcohol Use Yes   Comment: Occasional Drinker     Social History   Substance and Sexual Activity  Drug Use Never    Social History   Socioeconomic History  . Marital status: Single    Spouse name: Not on file  . Number of children: Not on file  . Years of education: Not on file  . Highest education level: Not on file  Occupational History  . Not on file  Tobacco Use  . Smoking status: Never Smoker  . Smokeless tobacco: Never Used  Substance and Sexual Activity  . Alcohol use: Yes    Comment: Occasional Drinker  . Drug use: Never  . Sexual activity: Not on file  Other Topics Concern  . Not on file  Social History Narrative  . Not on file   Social Determinants of Health   Financial Resource Strain: Not on file  Food Insecurity: Not on file  Transportation Needs: Not on file  Physical Activity: Not on file  Stress: Not on file  Social Connections: Not on file   Additional Social History:    Allergies:   Allergies  Allergen Reactions  . Red Dye Itching    Labs:  Results for orders placed  or performed during the hospital encounter of 09/21/20 (from the past 48 hour(s))  Comprehensive metabolic panel     Status: Abnormal   Collection Time: 09/21/20 12:00 AM  Result Value Ref Range   Sodium 137 135 - 145 mmol/L   Potassium 3.4 (L) 3.5 - 5.1 mmol/L   Chloride 108 98 - 111 mmol/L   CO2 22 22 - 32 mmol/L   Glucose, Bld 113 (H) 70 - 99 mg/dL    Comment: Glucose reference range applies only to samples taken after fasting for at least 8 hours.   BUN 9 6 - 20 mg/dL   Creatinine, Ser 0.980.59 0.44 - 1.00 mg/dL   Calcium  9.3 8.9 - 11.910.3 mg/dL   Total Protein 7.2 6.5 - 8.1 g/dL   Albumin 4.4 3.5 - 5.0 g/dL   AST 16 15 - 41 U/L   ALT 18 0 - 44 U/L   Alkaline Phosphatase 57 38 - 126 U/L   Total Bilirubin 0.9 0.3 - 1.2 mg/dL   GFR, Estimated >14>60 >78>60 mL/min    Comment: (NOTE) Calculated using the CKD-EPI Creatinine Equation (2021)    Anion gap 7 5 - 15    Comment: Performed at [facility], [address] [place], [place] [address]  Ethanol     Status: None   Collection Time: 09/21/20 12:00 AM  Result Value Ref Range   Alcohol, Ethyl (B) <10 <10 mg/dL    Comment: (NOTE) Lowest detectable limit for serum alcohol is 10 mg/dL.  For medical purposes only. Performed at [facility], [address] [place], [place] [address]   Salicylate level     Status: Abnormal   Collection Time: 09/21/20 12:00 AM  Result Value Ref Range   Salicylate Lvl <7.0 (L) 7.0 - 30.0 mg/dL    Comment: Performed at [facility], [address] [place], [place] [address]  Acetaminophen level     Status: Abnormal   Collection Time: 09/21/20 12:00 AM  Result Value Ref Range   Acetaminophen (Tylenol), Serum <10 (L) 10 - 30 ug/mL    Comment: (NOTE) Therapeutic concentrations vary significantly. A range of 10-30 ug/mL  may be an effective concentration for many patients. However, some  are best treated at concentrations outside of this range. Acetaminophen concentrations >150 ug/mL at 4 hours after ingestion  and >50 ug/mL at 12 hours after ingestion are often associated with  toxic reactions.  Performed at [facility], [address] [place], [place] [address]   cbc     Status: None   Collection Time: 09/21/20 12:00 AM  Result Value Ref Range   WBC 6.1 4.0 - 10.5 K/uL   RBC 4.15 3.87 - 5.11 MIL/uL   Hemoglobin 12.1 12.0 - 15.0 g/dL   HCT 28.436.0 13.236.0 - 44.046.0 %   MCV 86.7 80.0 - 100.0 fL   MCH 29.2 26.0 - 34.0 pg   MCHC 33.6 30.0 - 36.0 g/dL   RDW 10.213.1 72.511.5 - 36.615.5 %   Platelets  226 150 - 400 K/uL   nRBC 0.0 0.0 - 0.2 %    Comment: Performed at [facility], [address] [place], [place] [address]  Urine Drug Screen, Qualitative     Status: Abnormal   Collection Time: 09/21/20 12:00 AM  Result Value Ref Range   Tricyclic, Ur Screen NONE DETECTED NONE DETECTED   Amphetamines, Ur Screen NONE DETECTED NONE DETECTED   MDMA (Ecstasy)Ur Screen NONE DETECTED NONE DETECTED  Cocaine Metabolite,Ur [place] NONE DETECTED NONE DETECTED   Opiate, Ur Screen NONE DETECTED NONE DETECTED   Phencyclidine (PCP) Ur S NONE DETECTED NONE DETECTED   Cannabinoid 50 Ng, Ur [place] POSITIVE (A) NONE DETECTED   Barbiturates, Ur Screen NONE DETECTED NONE DETECTED   Benzodiazepine, Ur Scrn NONE DETECTED NONE DETECTED   Methadone Scn, Ur NONE DETECTED NONE DETECTED    Comment: (NOTE) Tricyclics + metabolites, urine    Cutoff 1000 ng/mL Amphetamines + metabolites, urine  Cutoff 1000 ng/mL MDMA (Ecstasy), urine              Cutoff 500 ng/mL Cocaine Metabolite, urine          Cutoff 300 ng/mL Opiate + metabolites, urine        Cutoff 300 ng/mL Phencyclidine (PCP), urine         Cutoff 25 ng/mL Cannabinoid, urine                 Cutoff 50 ng/mL Barbiturates + metabolites, urine  Cutoff 200 ng/mL Benzodiazepine, urine              Cutoff 200 ng/mL Methadone, urine                   Cutoff 300 ng/mL  The urine drug screen provides only a preliminary, unconfirmed analytical test result and should not be used for non-medical purposes. Clinical consideration and professional judgment should be applied to any positive drug screen result due to possible interfering substances. A more specific alternate chemical method must be used in order to obtain a confirmed analytical result. Gas chromatography / mass spectrometry (GC/MS) is the preferred confirm atory method. Performed at [facility], [address] [place], [place] [address]   POC urine preg, ED     Status: None    Collection Time: 09/21/20 12:06 AM  Result Value Ref Range   Preg Test, Ur NEGATIVE NEGATIVE    Comment:        THE SENSITIVITY OF THIS METHODOLOGY IS >24 mIU/mL   Resp Panel by RT-PCR (Flu A&B, Covid) Nasopharyngeal Swab     Status: None   Collection Time: 09/21/20 10:26 AM   Specimen: Nasopharyngeal Swab; Nasopharyngeal(NP) swabs in vial transport medium  Result Value Ref Range   SARS Coronavirus 2 by RT PCR NEGATIVE NEGATIVE    Comment: (NOTE) SARS-CoV-2 target nucleic acids are NOT DETECTED.  The SARS-CoV-2 RNA is generally detectable in upper respiratory specimens during the acute phase of infection. The lowest concentration of SARS-CoV-2 viral copies this assay can detect is 138 copies/mL. A negative result does not preclude SARS-Cov-2 infection and should not be used as the sole basis for treatment or other patient management decisions. A negative result may occur with  improper specimen collection/handling, submission of specimen other than nasopharyngeal swab, presence of viral mutation(s) within the areas targeted by this assay, and inadequate number of viral copies(<138 copies/mL). A negative result must be combined with clinical observations, patient history, and epidemiological information. The expected result is Negative.  Fact Sheet for Patients:  BloggerCourse.com  Fact Sheet for Healthcare Providers:  SeriousBroker.it  This test is no t yet approved or cleared by the [place] FDA and  has been authorized for detection and/or diagnosis of SARS-CoV-2 by FDA under an Emergency Use Authorization (EUA). This EUA will remain  in effect (meaning this test can be used) for the duration of the COVID-19 declaration under Section 564(b)(1) of the Act, 21 U.S.C.section 360bbb-3(b)(1),  unless the authorization is terminated  or revoked sooner.       Influenza A by PCR NEGATIVE NEGATIVE   Influenza B by PCR NEGATIVE  NEGATIVE    Comment: (NOTE) The Xpert Xpress SARS-CoV-2/FLU/RSV plus assay is intended as an aid in the diagnosis of influenza from Nasopharyngeal swab specimens and should not be used as a sole basis for treatment. Nasal washings and aspirates are unacceptable for Xpert Xpress SARS-CoV-2/FLU/RSV testing.  Fact Sheet for Patients: BloggerCourse.com  Fact Sheet for Healthcare Providers: SeriousBroker.it  This test is not yet approved or cleared by the [place] FDA and has been authorized for detection and/or diagnosis of SARS-CoV-2 by FDA under an Emergency Use Authorization (EUA). This EUA will remain in effect (meaning this test can be used) for the duration of the COVID-19 declaration under Section 564(b)(1) of the Act, 21 U.S.C. section 360bbb-3(b)(1), unless the authorization is terminated or revoked.  Performed at [facility], [address], [place], [place] [address]     Current Facility-Administered Medications  Medication Dose Route Frequency Provider Last Rate Last Admin  . FLUoxetine (PROZAC) capsule 40 mg  40 mg Oral Daily Gillermo Murdoch, NP   40 mg at 09/21/20 6045   Current Outpatient Medications  Medication Sig Dispense Refill  . acetaminophen (TYLENOL) 325 MG tablet Take 650 mg by mouth every 6 (six) hours as needed for mild pain, moderate pain, fever or headache.    Marland Kitchen FLUoxetine (PROZAC) 40 MG capsule Take 40 mg by mouth daily.    . traZODone (DESYREL) 50 MG tablet Take 75 mg by mouth at bedtime as needed for sleep.     Marland Kitchen triamcinolone (KENALOG) 0.025 % ointment Apply 1 application topically 2 (two) times daily as needed. Itching      Musculoskeletal: Strength & Muscle Tone: within normal limits Gait & Station: normal Patient leans: N/A  Psychiatric Specialty Exam: Physical Exam Vitals and nursing note reviewed.  Constitutional:      Appearance: She is well-developed and  well-nourished.  HENT:     Head: Normocephalic and atraumatic.  Eyes:     Conjunctiva/sclera: Conjunctivae normal.     Pupils: Pupils are equal, round, and reactive to light.  Cardiovascular:     Heart sounds: Normal heart sounds.  Pulmonary:     Effort: Pulmonary effort is normal.  Abdominal:     Palpations: Abdomen is soft.  Musculoskeletal:        General: Normal range of motion.     Cervical back: Normal range of motion.  Skin:    General: Skin is warm and dry.  Neurological:     General: No focal deficit present.     Mental Status: She is alert.  Psychiatric:        Attention and Perception: Attention normal.        Mood and Affect: Mood is anxious and depressed.        Speech: Speech normal.        Behavior: Behavior normal.        Thought Content: Thought content normal.        Cognition and Memory: Cognition normal.        Judgment: Judgment is impulsive.     Review of Systems  Constitutional: Negative.   HENT: Negative.   Eyes: Negative.   Respiratory: Negative.   Cardiovascular: Negative.   Gastrointestinal: Negative.   Musculoskeletal: Negative.   Skin: Negative.   Neurological: Negative.   Psychiatric/Behavioral: Positive for dysphoric mood. The  patient is nervous/anxious.     Blood pressure 95/60, pulse 65, temperature 97.9 F (36.6 C), temperature source Oral, resp. rate 18, SpO2 100 %.There is no height or weight on file to calculate BMI.  General Appearance: Casual  Eye Contact:  Good  Speech:  Clear and Coherent  Volume:  Normal  Mood:  Dysphoric  Affect:  Congruent  Thought Process:  Goal Directed  Orientation:  Full (Time, Place, and Person)  Thought Content:  Logical  Suicidal Thoughts:  No  Homicidal Thoughts:  No  Memory:  Immediate;   Fair Recent;   Fair Remote;   Fair  Judgement:  Fair  Insight:  Fair  Psychomotor Activity:  Normal  Concentration:  Concentration: Fair  Recall:  [employer] of Knowledge:  Fair  Language:  Fair   Akathisia:  No  Handed:  Right  AIMS (if indicated):     Assets:  Desire for Improvement  ADL's:  Intact  Cognition:  WNL  Sleep:        Treatment Plan Summary: Plan Patient took an overdose of trazodone.  In some ways almost seems like the point was more to get brought to the hospital and then to kill her self as she states that she knows that the 300 mg was not a fatal dose.  Nevertheless she requested to be brought to the emergency room.  Patient has been placed under IVC and the current plan is for admission to psychiatric hospital in [place] with a proposed plan for partial hospitalization afterwards.  Patient's family are reported to be on their way down from their home in [place].  Patient will remain in the emergency room here until transportation is available to [facility] which at this point is scheduled for tomorrow morning.  Patient is aware of the plan and agreeable.  Continue usual medication for now.  Disposition: Recommend psychiatric Inpatient admission when medically cleared. Supportive therapy provided about ongoing stressors.  Mordecai Rasmussen, MD 09/21/2020 2:47 PM

## 2020-09-21 NOTE — ED Notes (Signed)
Hourly rounding completed at this time, patient currently asleep in room. No complaints, stable, and in no acute distress. Q15 minute rounds and monitoring via Security Cameras to continue. 

## 2020-09-21 NOTE — ED Notes (Signed)
Pt denies SI, reports last night was to sleep and get away from stresses.

## 2020-09-21 NOTE — ED Notes (Signed)
[facility] notified to cancel 6-24 hour test.

## 2020-09-21 NOTE — BH Assessment (Signed)
Comprehensive Clinical Assessment (CCA) Note  09/21/2020 Cathy Garner [id number]  Chief Complaint: Patient is a 20 year old female presenting to [facility] ED under IVC. Per triage note Pt reports taking approximately 5 Trazodone since 2130. When asked if this was to harm self, pt responds "yes." Pt reports she is still actively having SI thoughts. Pt with hx/o mental health disorders and in-patient placement. Pt is calm and cooperative in triage. During assessment patient appears alert and oriented x4, calm and cooperative. 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 Patient reports that she receives medication from her primary care doctor back at home and has been prescribed Prozac in addition, she reports that she takes that medication as prescribed. Patient reports 1 past hospitalization "last spring I checked myself in back at home, I was having suicidal thoughts but I didn't attempt." Patient continues to report SI, denies HI/AH/VH and does not appear to be responding to any internal or external stimuli.  Per Psyc NP Elenore Paddy patient is recommended for Inpatient Hospitalization  Chief Complaint  Patient presents with  . Ingestion  . Suicidal   Visit Diagnosis: Major Depressive Disorder, recurrent episode,severe    CCA Screening, Triage and Referral (STR)  Patient Reported Information How did you hear about [place]? Other (Comment)  Referral name: No data recorded Referral phone number: No data recorded  Whom do you see for routine medical problems? Primary  Care  Practice/Facility Name: No data recorded Practice/Facility Phone Number: No data recorded Name of Contact: No data recorded Contact Number: No data recorded Contact Fax Number: No data recorded Prescriber Name: No data recorded Prescriber Address (if known): No data recorded  What Is the Reason for Your Visit/Call Today? No data recorded How Long Has This Been Causing You Problems? > than 6 months  What Do You Feel Would Help You the Most Today? Assessment Only; Therapy; Medication   Have You Recently Been in Any Inpatient Treatment (Hospital/Detox/Crisis Center/28-Day Program)? No  Name/Location of Program/Hospital:No data recorded How Long Were You There? No data recorded When Were You Discharged? No data recorded  Have You Ever Received Services From [facility] Before? No  Who Do You See at [facility]? No data recorded  Have You Recently Had Any Thoughts About Hurting Yourself? Yes  Are You Planning to Commit Suicide/Harm Yourself At This time? Yes   Have you Recently Had Thoughts About Hurting Someone Karolee Ohs? No  Explanation: No data recorded  Have You Used Any Alcohol or Drugs in the Past 24 Hours? No  How Long Ago Did You Use Drugs or Alcohol? No data recorded What Did You Use and How Much? No data recorded  Do You Currently Have a Therapist/Psychiatrist? Yes  Name of Therapist/Psychiatrist: No data recorded  Have You Been Recently Discharged From Any Office Practice or Programs? No data recorded Explanation of Discharge From Practice/Program: No data recorded    CCA Screening Triage Referral Assessment Type of Contact: Face-to-Face  Is this Initial or Reassessment? No data recorded Date Telepsych consult ordered in [facility]:  No data recorded Time Telepsych consult ordered in [facility]:  No data recorded  Patient Reported Information Reviewed? Yes  Patient Left Without Being Seen? No data recorded Reason for Not Completing Assessment: No data  recorded  Collateral Involvement: No data recorded  Does Patient Have a Court Appointed Legal Guardian? No data recorded Name and Contact of Legal Guardian: No data recorded If Minor and Not Living with Parent(s), Who has Custody? No data recorded Is CPS involved or ever been involved? Never  Is APS involved or ever been involved? Never   Patient Determined To Be At Risk for Harm To Self or Others Based on Review of Patient Reported Information or Presenting Complaint? Yes, for Self-Harm  Method: No data recorded Availability of Means: No data recorded Intent: No data recorded Notification Required: No data recorded Additional Information for Danger to Others Potential: No data recorded Additional Comments for Danger to Others Potential: No data recorded Are There Guns or Other Weapons in Your Home? No data recorded Types of Guns/Weapons: No data recorded Are These Weapons Safely Secured?                            No data recorded Who Could Verify You Are Able To Have These Secured: No data recorded Do You Have any Outstanding Charges, Pending Court Dates, Parole/Probation? No data recorded Contacted To Inform of Risk of Harm To Self or Others: No data recorded  Location of Assessment: [facility] ED   Does Patient Present under Involuntary Commitment? Yes  IVC Papers Initial File Date: 09/21/2020   [place] of Residence[place] [address]   Patient Currently Receiving the Following Services: No data recorded  Determination of Need: Emergent (2 hours)   Options For Referral: No data recorded    CCA Biopsychosocial Intake/Chief Complaint:  Patient presenting to [facility] ED under IVC due to suicide attempt  Current Symptoms/Problems: Patient presenting to [facility] ED under IVC due to suicide attempt   Patient Reported Schizophrenia/Schizoaffective Diagnosis in Past: No   Strengths: Patient is currently in college and studying Psychology  Preferences: Unknown  Abilities: Patient is  able to communicate and complet her ADLs independently   Type of Services Patient Feels are Needed: Unknown   Initial Clinical Notes/Concerns: None   Mental Health Symptoms Depression:  Hopelessness; Change in energy/activity; Difficulty Concentrating   Duration of Depressive symptoms: Greater than two weeks   Mania:  None   Anxiety:   None   Psychosis:  None   Duration of Psychotic symptoms: No data recorded  Trauma:  None   Obsessions:  None   Compulsions:  None   Inattention:  None   Hyperactivity/Impulsivity:  N/A   Oppositional/Defiant Behaviors:  None   Emotional Irregularity:  None   Other Mood/Personality Symptoms:  No data recorded   Mental Status Exam Appearance and self-care  Stature:  Average   Weight:  Average weight   Clothing:  Casual   Grooming:  Normal   Cosmetic use:  None   Posture/gait:  Normal   Motor activity:  Not Remarkable   Sensorium  Attention:  Normal   Concentration:  Normal   Orientation:  X5   Recall/memory:  Normal   Affect and Mood  Affect:  Full Range   Mood:  -- (Pleasant)   Relating  Eye contact:  Normal   Facial expression:  Responsive   Attitude toward examiner:  Cooperative   Thought and Language  Speech flow: Clear  and Coherent   Thought content:  Appropriate to Mood and Circumstances   Preoccupation:  None   Hallucinations:  None   Organization:  No data recorded  Affiliated Computer ServicesExecutive Functions  Fund of Knowledge:  Fair   Intelligence:  Average   Abstraction:  Normal   Judgement:  Poor   Reality Testing:  Adequate   Insight:  Lacking; Poor   Decision Making:  Impulsive   Social Functioning  Social Maturity:  Responsible   Social Judgement:  Normal   Stress  Stressors:  School   Coping Ability:  Exhausted   Skill Deficits:  None   Supports:  Family; Friends/Service system     Religion: Religion/Spirituality Are You A Religious Person?: No  Leisure/Recreation: Leisure /  Recreation Do You Have Hobbies?: No  Exercise/Diet: Exercise/Diet Do You Exercise?: No Have You Gained or Lost A Significant Amount of Weight in the Past Six Months?: No Do You Follow a Special Diet?: No Do You Have Any Trouble Sleeping?: No   CCA Employment/Education Employment/Work Situation: Employment / Work Psychologist, occupationalituation Employment situation: Surveyor, mineralstudent Patient's job has been impacted by current illness: No What is the longest time patient has a held a job?: Unknown Where was the patient employed at that time?: Patient is a Consulting civil engineerstudent Has patient ever been in the Eli Lilly and Companymilitary?: No  Education: Education Is Patient Currently Attending School?: Yes School Currently Attending: [employer] Last Grade Completed: 12 Name of High School: Unknown Did Garment/textile technologistYou Graduate From [employer]?: Yes Did Theme park managerYou Attend College?: Yes What Type of College Degree Do you Have?: Patient is currently a student What Was Your Major?: Patient is a Psychology major Did You Have Any Special Interests In School?: Unknown Did You Have An Individualized Education Program (IIEP): No Did You Have Any Difficulty At School?: No Patient's Education Has Been Impacted by Current Illness: No   CCA Family/Childhood History Family and Relationship History: Family history Marital status: Single Are you sexually active?:  (Unknown) What is your sexual orientation?: Unknown Has your sexual activity been affected by drugs, alcohol, medication, or emotional stress?: No Does patient have children?: No  Childhood History:  Childhood History By whom was/is the patient raised?: Mother,Father Additional childhood history information: None reported Description of patient's relationship with caregiver when they were a child: None reported Patient's description of current relationship with people who raised him/her: None reported How were you disciplined when you got in trouble as a child/adolescent?: None reported Does patient have  siblings?: Yes Number of Siblings: 1 Description of patient's current relationship with siblings: Patient reports relationship with brother is good Did patient suffer any verbal/emotional/physical/sexual abuse as a child?: No Did patient suffer from severe childhood neglect?: No Has patient ever been sexually abused/assaulted/raped as an adolescent or adult?: No Was the patient ever a victim of a crime or a disaster?: No Witnessed domestic violence?: No Has patient been affected by domestic violence as an adult?: No  Child/Adolescent Assessment:     CCA Substance Use Alcohol/Drug Use: Alcohol / Drug Use Pain Medications: See MAR Prescriptions: See MAR Over the Counter: See MAR History of alcohol / drug use?:  (Patient reports drinking every 2 weeks)                         ASAM's:  Six Dimensions of Multidimensional Assessment  Dimension 1:  Acute Intoxication and/or Withdrawal Potential:      Dimension 2:  Biomedical Conditions and Complications:  Dimension 3:  Emotional, Behavioral, or Cognitive Conditions and Complications:     Dimension 4:  Readiness to Change:     Dimension 5:  Relapse, Continued use, or Continued Problem Potential:     Dimension 6:  Recovery/Living Environment:     ASAM Severity Score:    ASAM Recommended Level of Treatment:     Substance use Disorder (SUD)    Recommendations for Services/Supports/Treatments:   Per Psyc NP Elenore Paddy patient is recommended for Inpatient Hospitalization  DSM5 Diagnoses: Patient Active Problem List   Diagnosis Date Noted  . MDD (major depressive disorder), recurrent episode, severe (HCC) 09/21/2020    Patient Centered Plan: Patient is on the following Treatment Plan(s):  Depression   Referrals to Alternative Service(s): Referred to Alternative Service(s):   Place:   Date:   Time:    Referred to Alternative Service(s):   Place:   Date:   Time:    Referred to Alternative Service(s):    Place:   Date:   Time:    Referred to Alternative Service(s):   Place:   Date:   Time:     Tracer Gutridge A Yliana Gravois, LCAS-A

## 2020-09-21 NOTE — ED Notes (Signed)
Rings x10 Bracelet x3 Watch Earrings x4 Necklace Green pants Black col[place] hoodie Black fur shoes Ankle bracelet x3 Underwear Bra Black cell phone Pink and blue water bottle

## 2020-09-21 NOTE — ED Notes (Signed)
Pt given meal tray.

## 2020-09-21 NOTE — BH Assessment (Signed)
Patient to be reviewed with [facility] later this morning 09/21/20, if no bed available patient to be referred to additional facilities  

## 2020-09-21 NOTE — ED Provider Notes (Signed)
Emergency Medicine Observation Re-evaluation Note  Cathy Fruge is a 20 y.o. female, seen on rounds today.  Pt initially presented to the ED for complaints of Ingestion and Suicidal  Currently, the patient is is no acute distress. Denies any concerns at this time.  She is sitting comfortably in bed.  Physical Exam  Blood pressure 120/79, pulse 86, temperature 98.3 F (36.8 C), temperature source Oral, resp. rate 18, SpO2 98 %.  Physical Exam General: No apparent distress HEENT: moist mucous membranes CV: RRR Pulm: Normal WOB GI: soft and non tender MSK: no edema or cyanosis Neuro: face symmetric, moving all extremities     ED Course / MDM   Clinical Course as of 09/21/20 0752  Tue Sep 21, 2020  0131 The patient has been placed in psychiatric observation due to the need to provide a safe environment for the patient while obtaining psychiatric consultation and evaluation, as well as ongoing medical and medication management to treat the patient's condition. The patient has been placed under full IVC at this time.   [DS]    Clinical Course User Index [DS] Delton Prairie, MD    I have reviewed the labs performed to date as well as medications administered while in observation.  Recent changes in the last 24 hours include none  Plan   Current plan is to continue to wait for psych plan/placement if felt warranted  Patient is under full IVC at this time.   Concha Se, MD 09/21/20 1028

## 2020-09-21 NOTE — ED Notes (Signed)
Updated Poison Control at this

## 2020-09-21 NOTE — ED Notes (Signed)
Pt states regret for taking the medications and denies any SI at this time. Pt reports that she did take them to "go to sleep and not wake up" but does not feel that way any more.

## 2020-09-21 NOTE — ED Notes (Signed)
IVC, accepted at [facility], 2.23.2022 after 8am

## 2020-09-21 NOTE — BH Assessment (Addendum)
PATIENT IS SCHEDULED FOR ADMISSION ANYTIME AFTER 8AM TOMORROW  Patient has been accepted to [facility] Patient assigned to: Bed 305-1 Accepting physician is Dr. Jola Babinski Call report to [phone] Representative was [facility]     ER Staff is aware of it:  Nitch[place], ER Bernette Redbird, ER MD  Maralyn Sago, Patient's Nurse  Patient's Family/Support System [facility], [phone]) have been updated as well.

## 2020-09-22 ENCOUNTER — Encounter (HOSPITAL_COMMUNITY): Payer: Self-pay | Admitting: Psychiatry

## 2020-09-22 ENCOUNTER — Other Ambulatory Visit: Payer: Self-pay

## 2020-09-22 ENCOUNTER — Inpatient Hospital Stay (HOSPITAL_COMMUNITY)
Admission: AD | Admit: 2020-09-22 | Discharge: 2020-09-26 | DRG: 885 | Disposition: A | Discharge status: Home / Self Care | Payer: 59 | Source: Intra-hospital | Attending: Psychiatry | Admitting: Psychiatry

## 2020-09-22 DIAGNOSIS — G4700 Insomnia, unspecified: Secondary | ICD-10-CM | POA: Diagnosis present

## 2020-09-22 DIAGNOSIS — Z20822 Contact with and (suspected) exposure to covid-19: Secondary | ICD-10-CM | POA: Diagnosis present

## 2020-09-22 DIAGNOSIS — F329 Major depressive disorder, single episode, unspecified: Secondary | ICD-10-CM | POA: Diagnosis present

## 2020-09-22 DIAGNOSIS — Z79899 Other long term (current) drug therapy: Secondary | ICD-10-CM | POA: Diagnosis not present

## 2020-09-22 DIAGNOSIS — F332 Major depressive disorder, recurrent severe without psychotic features: Secondary | ICD-10-CM | POA: Diagnosis present

## 2020-09-22 DIAGNOSIS — K5900 Constipation, unspecified: Secondary | ICD-10-CM | POA: Diagnosis present

## 2020-09-22 DIAGNOSIS — R45851 Suicidal ideations: Secondary | ICD-10-CM | POA: Diagnosis present

## 2020-09-22 DIAGNOSIS — Z9151 Personal history of suicidal behavior: Secondary | ICD-10-CM | POA: Diagnosis not present

## 2020-09-22 MED ORDER — FLUOXETINE HCL 20 MG PO CAPS
40.0000 mg | ORAL_CAPSULE | Freq: Every day | ORAL | Status: DC
  Filled 2020-09-22 (×4): qty 2

## 2020-09-22 MED ORDER — HYDROXYZINE HCL 25 MG PO TABS: 25.0000 mg | ORAL_TABLET | Freq: Three times a day (TID) | ORAL | Status: DC | PRN

## 2020-09-22 MED ORDER — MAGNESIUM HYDROXIDE 400 MG/5ML PO SUSP: 30.0000 mL | Freq: Every day | ORAL | Status: DC | PRN

## 2020-09-22 MED ORDER — FLUOXETINE HCL 20 MG PO CAPS
40.0000 mg | ORAL_CAPSULE | Freq: Every day | ORAL | Status: DC
  Administered 2020-09-22 – 2020-09-25 (×4): 40 mg via ORAL
  Filled 2020-09-22 (×5): qty 2

## 2020-09-22 MED ORDER — TRAZODONE HCL 50 MG PO TABS
50.0000 mg | ORAL_TABLET | Freq: Every evening | ORAL | Status: DC | PRN
  Administered 2020-09-22 – 2020-09-25 (×3): 50 mg via ORAL
  Filled 2020-09-22 (×3): qty 1

## 2020-09-22 MED ORDER — ARIPIPRAZOLE 2 MG PO TABS
2.0000 mg | ORAL_TABLET | Freq: Every day | ORAL | Status: DC
  Administered 2020-09-22 – 2020-09-23 (×2): 2 mg via ORAL
  Filled 2020-09-22 (×3): qty 1

## 2020-09-22 MED ORDER — ALUM & MAG HYDROXIDE-SIMETH 200-200-20 MG/5ML PO SUSP: 30.0000 mL | ORAL | Status: DC | PRN

## 2020-09-22 MED ORDER — ACETAMINOPHEN 325 MG PO TABS: 650.0000 mg | ORAL_TABLET | Freq: Four times a day (QID) | ORAL | Status: DC | PRN

## 2020-09-22 NOTE — ED Notes (Signed)
Transporter has arrived  - called back to accepting unit - report given to [facility] RN  Pt to transfer

## 2020-09-22 NOTE — ED Notes (Signed)
Called to give report  - delayed due to 0800 med pass on accepting unit  - waiting for a return call from the RN   Secretary has called transporter     Pt is aware of her pending transfer  - VSS   assessment completed   She denies pain   A new mask, toothpaste and toothbrush and deodorant provided

## 2020-09-22 NOTE — Progress Notes (Signed)
The patient learned today about adjusting to the new environment as today was her first full day. Her goal for tomorrow is to talk about her anxiety dealing with school.

## 2020-09-22 NOTE — ED Notes (Signed)
Hourly rounding completed at this time, patient currently asleep in room. No complaints, stable, and in no acute distress. Q15 minute rounds and monitoring via Security Cameras to continue. 

## 2020-09-22 NOTE — H&P (Signed)
Psychiatric Admission Assessment Adult  Patient Identification: Cathy Garner MRN:  [id number] Date of Evaluation:  09/22/2020 Chief Complaint:  Major depression [F32.9] Principal Diagnosis: <principal problem not specified> Diagnosis:  Active Problems:   Major depression  History of Present Illness:   Per admission Note:  "I became overwhelmed and took about 5-6 Trazadone."  Cathy Garner is a 20 y.o. female patient presented to [facility] ED under involuntary commitment status (IVC). Per the ED triage nurse note, Pt reports taking approximately 5 Trazodone since 2130. When asked if this was to harm self, pt responds "yes." Pt reports she is still actively having SI thoughts. Pt with hx/o mental health disorders and in-patient placement. Pt is calm and cooperative in triage. The Pt reports taking approximately 5 Trazodone since 2130. When asked if this was to harm self, pt responds "yes." Pt reports she is still actively having SI thoughts. Pt with hx/o mental health disorders and in-patient placement. The patient is calm and cooperative in triage.   The patient was seen face-to-face by this provider; the chart was reviewed and consulted with Dr. Katrinka Blazing on 09/21/2020 due to the patient's care. It was discussed with the EDP that the patient does meet the criteria to be admitted to the psychiatric inpatient unit.  The patient continued to be matter-of-fact about her action tonight. The patient stated her behavior tonight partially stems from her taking on other people's problems. The patient disclosed she is from [place] and her parents are aware of what happened tonight (02.22.22). 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 The patient admits to having a therapist, and her primary care prescribes her medication. She reports being prescribed Prozac 40 mg daily and has taken this medication for almost a year. The patient voiced that she does not think the medication has been  as effective as she hoped.  The patient discussed she had been hospitalized once before today's visit. She narrated it was last spring, and she had suicidal ideation. She expressed checking herself into the hospital before she would do something worse. She stated she was in the hospital for a week and then partial hospitalization for three weeks. On evaluation, the patient is alert and oriented x 4, calm and cooperative, and mood-congruent with affect. The patient does not appear to be responding to internal or external stimuli. Neither is the patient presenting with any delusional thinking. The patient denies auditory or visual hallucinations. The patient denies any suicidal, homicidal, or self-harm ideations. The patient is not presenting with any psychotic or paranoid behaviors. During an encounter with the patient, she answered questions appropriately.   HPI: per Dr. Lyda Jester McDowellis a 20 y.o.femalewho presents to the ED for evaluation ofsuicidality and possible overdose. Chart review indicateshistory of anxiety and depression. Patient is a Consulting civil engineer at [employer]. She denies any recent illnesses. She reports being prescribed SSRI and trazodone. Patient reports psychosocial stressors precipitating acute suicidality and depression causing her to take 5-6 tablets of her prescribed trazodone 50 mg tablets. She reports taking extra tablets in order "to fall asleep forever"and with the intent of suicide. She reports this was at 9:30 PM on 2/21. She denies taking any additional prescription or recreational drugs this evening beyond the trazodone. She reports continued desire for suicide. Denies homicidality or hallucinations at this time."   Patient acknowledged the above information as accurate.                Associated Signs/Symptoms: Depression Symptoms:  depressed  mood, Duration of Depression Symptoms: Greater than two weeks  Anxiety Symptoms:  Panic  Symptoms, IN the past, no panic attacks for approx 1 year.    Duration of Psychotic Symptoms: No data recorded P Total Time spent with patient: 1 hour  Past Psychiatric History: Notable for prior hospitalization for SI. Patient is currently prescribed prozac and trazodone as well as hydroxyzine PRN> She sees a therapist since October.  Is the patient at risk to self? No.  Has the patient been a risk to self in the past 6 months? No.  Has the patient been a risk to self within the distant past? Yes.    Is the patient a risk to others? No.  Has the patient been a risk to others in the past 6 months? No.  Has the patient been a risk to others within the distant past? No.   Prior Inpatient Therapy:   Prior Outpatient Therapy:    Alcohol Screening: 1. How often do you have a drink containing alcohol?: 2 to 4 times a month 2. How many drinks containing alcohol do you have on a typical day when you are drinking?: 1 or 2 3. How often do you have six or more drinks on one occasion?: Less than monthly AUDIT-C Score: 3 4. How often during the last year have you found that you were not able to stop drinking once you had started?: Never 5. How often during the last year have you failed to do what was normally expected from you because of drinking?: Never 6. How often during the last year have you needed a first drink in the morning to get yourself going after a heavy drinking session?: Never 7. How often during the last year have you had a feeling of guilt of remorse after drinking?: Never 8. How often during the last year have you been unable to remember what happened the night before because you had been drinking?: Never 9. Have you or someone else been injured as a result of your drinking?: No 10. Has a relative or friend or a doctor or another health worker been concerned about your drinking or suggested you cut down?: No Alcohol Use Disorder Identification Test Final Score (AUDIT): 3 Substance  Abuse History in the last 12 months:  Yes.   Consequences of Substance Abuse: NA Previous Psychotropic Medications: Yes  Psychological Evaluations: Yes  Past Medical History:  Past Medical History:  Diagnosis Date  . Anxiety   . Depression     Past Surgical History:  Procedure Laterality Date  . APPENDECTOMY    . WISDOM TOOTH EXTRACTION     Family History: History reviewed. No pertinent family history. Family Psychiatric  History: Denies Tobacco Screening: Have you used any form of tobacco in the last 30 days? (Cigarettes, Smokeless Tobacco, Cigars, and/or Pipes): No Social History:  Social History   Substance and Sexual Activity  Alcohol Use Yes   Comment: Occasional Drinker     Social History   Substance and Sexual Activity  Drug Use Never    Additional Social History:  Patient is bisexual, attends [employer] and is Nature conservation officerstudying psychology. [place] where her parents and adoptive brother still reside. 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                         Allergies:   Allergies  Allergen Reactions  .  Red Dye Itching and Other (See Comments)   Lab Results:  Results for orders placed or performed during the hospital encounter of 09/21/20 (from the past 48 hour(s))  Comprehensive metabolic panel     Status: Abnormal   Collection Time: 09/21/20 12:00 AM  Result Value Ref Range   Sodium 137 135 - 145 mmol/L   Potassium 3.4 (L) 3.5 - 5.1 mmol/L   Chloride 108 98 - 111 mmol/L   CO2 22 22 - 32 mmol/L   Glucose, Bld 113 (H) 70 - 99 mg/dL    Comment: Glucose reference range applies only to samples taken after fasting for at least 8 hours.   BUN 9 6 - 20 mg/dL   Creatinine, Ser 6.14 0.44 - 1.00 mg/dL   Calcium 9.3 8.9 - 43.1 mg/dL   Total Protein 7.2 6.5 - 8.1 g/dL   Albumin 4.4 3.5 - 5.0 g/dL   AST 16 15 - 41 U/L   ALT 18 0 - 44 U/L   Alkaline  Phosphatase 57 38 - 126 U/L   Total Bilirubin 0.9 0.3 - 1.2 mg/dL   GFR, Estimated >54 >00 mL/min    Comment: (NOTE) Calculated using the CKD-EPI Creatinine Equation (2021)    Anion gap 7 5 - 15    Comment: Performed at [facility], [address], [place], [place] [address]  Ethanol     Status: None   Collection Time: 09/21/20 12:00 AM  Result Value Ref Range   Alcohol, Ethyl (B) <10 <10 mg/dL    Comment: (NOTE) Lowest detectable limit for serum alcohol is 10 mg/dL.  For medical purposes only. Performed at [facility], [address] [place], [place] [address]   Salicylate level     Status: Abnormal   Collection Time: 09/21/20 12:00 AM  Result Value Ref Range   Salicylate Lvl <7.0 (L) 7.0 - 30.0 mg/dL    Comment: Performed at [facility], [address] [place], [place] [address]  Acetaminophen level     Status: Abnormal   Collection Time: 09/21/20 12:00 AM  Result Value Ref Range   Acetaminophen (Tylenol), Serum <10 (L) 10 - 30 ug/mL    Comment: (NOTE) Therapeutic concentrations vary significantly. A range of 10-30 ug/mL  may be an effective concentration for many patients. However, some  are best treated at concentrations outside of this range. Acetaminophen concentrations >150 ug/mL at 4 hours after ingestion  and >50 ug/mL at 12 hours after ingestion are often associated with  toxic reactions.  Performed at [facility], [address] [place], [place] [address]   cbc     Status: None   Collection Time: 09/21/20 12:00 AM  Result Value Ref Range   WBC 6.1 4.0 - 10.5 K/uL   RBC 4.15 3.87 - 5.11 MIL/uL   Hemoglobin 12.1 12.0 - 15.0 g/dL   HCT 98.3 38.2 - 50.5 %   MCV 86.7 80.0 - 100.0 fL   MCH 29.2 26.0 - 34.0 pg   MCHC 33.6 30.0 - 36.0 g/dL   RDW 39.7 67.3 - 41.9 %   Platelets 226 150 - 400 K/uL   nRBC 0.0 0.0 - 0.2 %    Comment: Performed at [facility], [address], [place], [place] [address]  Urine  Drug Screen, Qualitative     Status: Abnormal   Collection Time: 09/21/20 12:00 AM  Result Value Ref Range   Tricyclic, Ur Screen NONE DETECTED NONE DETECTED   Amphetamines, Ur  Screen NONE DETECTED NONE DETECTED   MDMA (Ecstasy)Ur Screen NONE DETECTED NONE DETECTED   Cocaine Metabolite,Ur [place] NONE DETECTED NONE DETECTED   Opiate, Ur Screen NONE DETECTED NONE DETECTED   Phencyclidine (PCP) Ur S NONE DETECTED NONE DETECTED   Cannabinoid 50 Ng, Ur [place] POSITIVE (A) NONE DETECTED   Barbiturates, Ur Screen NONE DETECTED NONE DETECTED   Benzodiazepine, Ur Scrn NONE DETECTED NONE DETECTED   Methadone Scn, Ur NONE DETECTED NONE DETECTED    Comment: (NOTE) Tricyclics + metabolites, urine    Cutoff 1000 ng/mL Amphetamines + metabolites, urine  Cutoff 1000 ng/mL MDMA (Ecstasy), urine              Cutoff 500 ng/mL Cocaine Metabolite, urine          Cutoff 300 ng/mL Opiate + metabolites, urine        Cutoff 300 ng/mL Phencyclidine (PCP), urine         Cutoff 25 ng/mL Cannabinoid, urine                 Cutoff 50 ng/mL Barbiturates + metabolites, urine  Cutoff 200 ng/mL Benzodiazepine, urine              Cutoff 200 ng/mL Methadone, urine                   Cutoff 300 ng/mL  The urine drug screen provides only a preliminary, unconfirmed analytical test result and should not be used for non-medical purposes. Clinical consideration and professional judgment should be applied to any positive drug screen result due to possible interfering substances. A more specific alternate chemical method must be used in order to obtain a confirmed analytical result. Gas chromatography / mass spectrometry (GC/MS) is the preferred confirm atory method. Performed at [facility], [address] [place], [place] [address]   POC urine preg, ED     Status: None   Collection Time: 09/21/20 12:06 AM  Result Value Ref Range   Preg Test, Ur NEGATIVE NEGATIVE    Comment:        THE SENSITIVITY OF  THIS METHODOLOGY IS >24 mIU/mL   Resp Panel by RT-PCR (Flu A&B, Covid) Nasopharyngeal Swab     Status: None   Collection Time: 09/21/20 10:26 AM   Specimen: Nasopharyngeal Swab; Nasopharyngeal(NP) swabs in vial transport medium  Result Value Ref Range   SARS Coronavirus 2 by RT PCR NEGATIVE NEGATIVE    Comment: (NOTE) SARS-CoV-2 target nucleic acids are NOT DETECTED.  The SARS-CoV-2 RNA is generally detectable in upper respiratory specimens during the acute phase of infection. The lowest concentration of SARS-CoV-2 viral copies this assay can detect is 138 copies/mL. A negative result does not preclude SARS-Cov-2 infection and should not be used as the sole basis for treatment or other patient management decisions. A negative result may occur with  improper specimen collection/handling, submission of specimen other than nasopharyngeal swab, presence of viral mutation(s) within the areas targeted by this assay, and inadequate number of viral copies(<138 copies/mL). A negative result must be combined with clinical observations, patient history, and epidemiological information. The expected result is Negative.  Fact Sheet for Patients:  BloggerCourse.com  Fact Sheet for Healthcare Providers:  SeriousBroker.it  This test is no t yet approved or cleared by the [place] FDA and  has been authorized for detection and/or diagnosis of SARS-CoV-2 by FDA under an Emergency Use Authorization (EUA). This EUA will remain  in effect (meaning this test can be used)  for the duration of the COVID-19 declaration under Section 564(b)(1) of the Act, 21 U.S.C.section 360bbb-3(b)(1), unless the authorization is terminated  or revoked sooner.       Influenza A by PCR NEGATIVE NEGATIVE   Influenza B by PCR NEGATIVE NEGATIVE    Comment: (NOTE) The Xpert Xpress SARS-CoV-2/FLU/RSV plus assay is intended as an aid in the diagnosis of influenza from  Nasopharyngeal swab specimens and should not be used as a sole basis for treatment. Nasal washings and aspirates are unacceptable for Xpert Xpress SARS-CoV-2/FLU/RSV testing.  Fact Sheet for Patients: BloggerCourse.com  Fact Sheet for Healthcare Providers: SeriousBroker.it  This test is not yet approved or cleared by the [place] FDA and has been authorized for detection and/or diagnosis of SARS-CoV-2 by FDA under an Emergency Use Authorization (EUA). This EUA will remain in effect (meaning this test can be used) for the duration of the COVID-19 declaration under Section 564(b)(1) of the Act, 21 U.S.C. section 360bbb-3(b)(1), unless the authorization is terminated or revoked.  Performed at [facility], [address] [place], [place] [address]     Blood Alcohol level:  Lab Results  Component Value Date   [facility] <10 09/21/2020    Metabolic Disorder Labs:  No results found for: HGBA1C, MPG No results found for: PROLACTIN No results found for: CHOL, TRIG, HDL, CHOLHDL, VLDL, LDLCALC  Current Medications: Current Facility-Administered Medications  Medication Dose Route Frequency Provider Last Rate Last Admin  . acetaminophen (TYLENOL) tablet 650 mg  650 mg Oral Q6H PRN Antonieta Pert, MD      . alum & mag hydroxide-simeth (MAALOX/MYLANTA) 200-200-20 MG/5ML suspension 30 mL  30 mL Oral Q4H PRN Antonieta Pert, MD      . ARIPiprazole (ABILIFY) tablet 2 mg  2 mg Oral QHS Emeka Lindner A, MD      . FLUoxetine (PROZAC) capsule 40 mg  40 mg Oral QHS Kariem Wolfson A, MD      . hydrOXYzine (ATARAX/VISTARIL) tablet 25 mg  25 mg Oral TID PRN Antonieta Pert, MD      . magnesium hydroxide (MILK OF MAGNESIA) suspension 30 mL  30 mL Oral Daily PRN Antonieta Pert, MD      . traZODone (DESYREL) tablet 50 mg  50 mg Oral QHS PRN Antonieta Pert, MD       PTA Medications: Medications Prior to Admission   Medication Sig Dispense Refill Last Dose  . acetaminophen (TYLENOL) 325 MG tablet Take 650 mg by mouth every 6 (six) hours as needed for mild pain, moderate pain, fever or headache.     Marland Kitchen FLUoxetine (PROZAC) 40 MG capsule Take 40 mg by mouth daily.     . traZODone (DESYREL) 50 MG tablet Take 75 mg by mouth at bedtime as needed for sleep.      Marland Kitchen triamcinolone (KENALOG) 0.025 % ointment Apply 1 application topically 2 (two) times daily as needed. Itching       Musculoskeletal: Strength & Muscle Tone: within normal limits Gait & Station: normal Patient leans: N/A  Psychiatric Specialty Exam: Physical Exam HENT:     Head: Normocephalic.  Eyes:     Pupils: Pupils are equal, round, and reactive to light.  Cardiovascular:     Rate and Rhythm: Normal rate.  Pulmonary:     Effort: Pulmonary effort is normal.  Musculoskeletal:        General: Normal range of motion.     Cervical back: Normal range of motion.  Neurological:     Mental Status: She is alert.     Review of Systems  Eyes: Negative for discharge.  Respiratory: Negative for apnea.   Cardiovascular: Negative for chest pain.  Gastrointestinal: Negative for abdominal distention.  Endocrine: Negative for cold intolerance.    Blood pressure 103/63, pulse 87, temperature 98.4 F (36.9 C), temperature source Oral, resp. rate 17, height 5\' 8"  (1.727 m), weight 66.7 kg, SpO2 99 %.Body mass index is 22.35 kg/m.  General Appearance: Casual  Eye Contact:  Fair  Speech:  Clear and Coherent  Volume:  Normal  Mood:  Anxious and Depressed  Affect:  Congruent  Thought Process:  Coherent  Orientation:  Full (Time, Place, and Person)  Thought Content:  Logical  Suicidal Thoughts:  No  Homicidal Thoughts:  No  Memory:  Recent;   Fair  Judgement:  Impaired  Insight:  Fair  Psychomotor Activity:  Normal  Concentration:  Concentration: Fair  Recall:  of Knowledge:  Fair  Language:  Fair  Akathisia:  No  Handed:  Right   AIMS (if indicated):     Assets:  Communication Skills Desire for Improvement Financial Resources/Insurance Housing Leisure Time Physical Health Resilience Social Support Talents/Skills Vocational/Educational  ADL's:  Intact  Cognition:  WNL  Sleep:       Treatment Plan Summary: Daily contact with patient to assess and evaluate symptoms and progress in treatment and Medication management    Patient is a 20 year old female currently in treatment who overdosed in the context of psycho social stressors. Patient still vague about specific triggers, will continue to elucidate more information during this hospitalization. Will augment SSRI with Abilify to help with impulsivity and mood stabilzation.  Diagnosis: MDD: Recurrent Severe  Plan:  Continue trazodone 50mg  QHS Continue Prozac 40mg  QHS Start Abilify 2mg  QHS SW discharge planning    Observation Level/Precautions:  15 minute checks  Laboratory:  CBC Chemistry Profile  Psychotherapy:    Medications:    Consultations:    Discharge Concerns:    Estimated LOS:  Other:     Physician Treatment Plan for Primary Diagnosis: <principal problem not specified> Long Term Goal(s): Improvement in symptoms so as ready for discharge  Short Term Goals: Ability to identify changes in lifestyle to reduce recurrence of condition will improve, Ability to verbalize feelings will improve, Ability to disclose and discuss suicidal ideas, Ability to demonstrate self-control will improve, Ability to identify and develop effective coping behaviors will improve, Ability to maintain clinical measurements within normal limits will improve, Compliance with prescribed medications will improve and Ability to identify triggers associated with substance abuse/mental health issues will improve  Physician Treatment Plan for Secondary Diagnosis: Active Problems:   Major depression  Long Term Goal(s): Improvement in symptoms so as ready for  discharge  Short Term Goals: Ability to identify changes in lifestyle to reduce recurrence of condition will improve, Ability to verbalize feelings will improve, Ability to disclose and discuss suicidal ideas, Ability to demonstrate self-control will improve, Ability to identify and develop effective coping behaviors will improve, Ability to maintain clinical measurements within normal limits will improve, Compliance with prescribed medications will improve and Ability to identify triggers associated with substance abuse/mental health issues will improve  I certify that inpatient services furnished can reasonably be expected to improve the patient's condition.    12, MD 2/23/20223:28 PM

## 2020-09-22 NOTE — ED Notes (Signed)
Pt asleep at this time, unable to collect vitals. Will collect pt vitals once awake. 

## 2020-09-22 NOTE — Tx Team (Signed)
Initial Treatment Plan 09/22/2020 11:58 AM Cathy Garner FBP:[id number]  PATIENT STRESSORS: Educational concerns   PATIENT STRENGTHS: Capable of independent living Wellsite geologist fund of knowledge Motivation for treatment/growth  PATIENT IDENTIFIED PROBLEMS: Stress from school exams and assignments  Taking on emotional challenges from others around her                   DISCHARGE CRITERIA:  Improved stabilization in mood, thinking, and/or behavior Motivation to continue treatment in a less acute level of care  PRELIMINARY DISCHARGE PLAN: Return to previous living arrangement Return to previous work or school arrangements  PATIENT/FAMILY INVOLVEMENT: This treatment plan has been presented to and reviewed with the patient, Cathy Garner. The patient has been given the opportunity to ask questions and make suggestions.  Celene Kras, RN 09/22/2020, 11:58 AM

## 2020-09-22 NOTE — Progress Notes (Signed)
   09/22/20 2037  Psych Admission Type (Psych Patients Only)  Admission Status Involuntary  Psychosocial Assessment  Patient Complaints None  Eye Contact Fair  Facial Expression Animated  Affect Appropriate to circumstance  Speech Logical/coherent  Interaction Assertive  Appearance/Hygiene Unremarkable  Behavior Characteristics Cooperative;Appropriate to situation  Mood Pleasant  Thought Process  Coherency WDL  Content WDL  Delusions WDL  Perception WDL  Hallucination None reported or observed  Judgment WDL  Confusion WDL  Danger to Self  Current suicidal ideation? Denies  Danger to Others  Danger to Others None reported or observed

## 2020-09-22 NOTE — BHH Suicide Risk Assessment (Signed)
[facility] Admission Suicide Risk Assessment   Nursing information obtained from:  Patient Demographic factors:  Adolescent or young adult Current Mental Status:  NA Loss Factors:  NA Historical Factors:  NA Risk Reduction Factors:  Positive social support  Total Time spent with patient: 1 hour Principal Problem: <principal problem not specified> Diagnosis:  Active Problems:   Major depression  Subjective Data: 20 year old admitted following suicide attempt.  Continued Clinical Symptoms:  Alcohol Use Disorder Identification Test Final Score (AUDIT): 3 The "Alcohol Use Disorders Identification Test", Guidelines for Use in Primary Care, Second Edition.  World Science writer [facility]). Score between 0-7:  no or low risk or alcohol related problems. Score between 8-15:  moderate risk of alcohol related problems. Score between 16-19:  high risk of alcohol related problems. Score 20 or above:  warrants further diagnostic evaluation for alcohol dependence and treatment.   CLINICAL FACTORS:   Depression:   Severe  See H&P  COGNITIVE FEATURES THAT CONTRIBUTE TO RISK:  None    SUICIDE RISK:   Moderate:  Frequent suicidal ideation with limited intensity, and duration, some specificity in terms of plans, no associated intent, good self-control, limited dysphoria/symptomatology, some risk factors present, and identifiable protective factors, including available and accessible social support.  PLAN OF CARE: Continue care on inpatient unit  I certify that inpatient services furnished can reasonably be expected to improve the patient's condition.   Clement Sayres, MD 09/22/2020, 3:58 PM

## 2020-09-22 NOTE — Progress Notes (Signed)
Patient came in to the hospital after taking several tablets of trazodone. She stated she was not sure if it was with suicidal intent, but she said "I wanted to sleep longer than normal". Patient felt that the stress of school and exams, on top of taking on emotional burdens from those around her became too much. Patient currently denies SI, HI, and AVH. Patient is calm and cooperative with the admission assessment.  Patient has strong support from her family and friends at school. She also states she felt like during her last inpatient stay last April, she felt the groups and engaging with others helped her get better.  Patient denies any tobacco or drug use, with the exception of an occasional drink or two of alcohol every other week. It is noted her drug screen is positive for cannabis.   Patient remains safe on the unit at this time and is interacting appropriately with peers. Q15 minute safety checks are maintained.

## 2020-09-23 MED ORDER — WHITE PETROLATUM EX OINT
TOPICAL_OINTMENT | CUTANEOUS | Status: AC
  Filled 2020-09-23: qty 5

## 2020-09-23 MED ORDER — NAPHAZOLINE-GLYCERIN 0.012-0.2 % OP SOLN: 1.0000 [drp] | Freq: Four times a day (QID) | OPHTHALMIC | Status: DC | PRN

## 2020-09-23 NOTE — [facility] Group Notes (Signed)
The focus of this group is to help patients establish daily goals to achieve during treatment and discuss how the patient can incorporate goal setting into their daily lives to aide in recovery.    Patient did not attend group today.

## 2020-09-23 NOTE — Progress Notes (Signed)
D:  Patient's self inventory sheet, patient has fair sleep, sleep medication helpful.  Good appetite, normal energy level, good concentration.  Rated depression 3, hopeless 2, anxiety 4.  Denied withdrawals.  Denied SI.  Physical problems, eyes irritated from not having glasses.  Denied physical pain.  Goal is releasing stress about school.  Plans to do breathing exercises and journaling.  Does have discharge plans. A:  Medications administered per MD orders.  Emotional support and encouragement given patient. R:   Denied SI and HI, contracts for safety.  Denied A/V hallucinations.  Safety maintained with 15 minute checks.

## 2020-09-23 NOTE — B[facility] Suicide Risk Assessment (Signed)
[facility] INPATIENT:  Family/Significant Other Suicide Prevention Education  Suicide Prevention Education:  Patient Refusal for Family/Significant Other Suicide Prevention Education: The patient Cathy Garner has refused to provide written consent for family/significant other to be provided Family/Significant Other Suicide Prevention Education during admission and/or prior to discharge.  Physician notified.  CSW completed SPE with the patient.  A pamphlet about suicide prevention will be placed in the patient's chart for review when the patient discharges.   [employer] Lycan Davee 09/23/2020, 10:38 AM

## 2020-09-23 NOTE — Plan of Care (Signed)
Nurse discussed anxiety, depression and coping skills with patient.  

## 2020-09-23 NOTE — [facility] Group Notes (Signed)
LCSW Group Therapy Note  Type of Therapy/Topic: Group Therapy: Six Dimensions of Wellness  Participation Level: Active  Description of Group: Worksheet Packet  This group will address the concept of wellness and the six concepts of wellness: occupational, physical, social, intellectual, spiritual, and emotional. Patients will be encouraged to process areas in their lives that are out of balance and identify reasons for remaining unbalanced. Patients will be encouraged to explore ways to practice healthy habits daily to attain better physical and mental health outcomes.  Therapeutic Goals:  1. Identify aspects of wellness that they are doing well.  2. Identify aspects of wellness that they would like to improve upon.  3. Identify one action they can take to improve an aspect of wellness in their lives.  Pt accepted the worksheet packet that was provided for group.   Mazin Emma, LCSWA Clinicial Social Worker [facility] 

## 2020-09-23 NOTE — Progress Notes (Signed)
The patient rated her day as a 9 out of 10 due to spending time socializing with her peers. Her goal for tomorrow is to be more social.

## 2020-09-23 NOTE — Progress Notes (Signed)
[facility] MD Progress Note  09/23/2020 1:37 PM Cathy Garner  MRN:  [id number] Subjective:  20 year old female with history of MDD who presented following episode of SI  Principal Problem: <principal problem not specified> Diagnosis: Active Problems:   Major depression  Total Time spent with patient: 20 minutes    CC" I'm learning a lot here"  Patient reports no adverse effects of the medications. Mood still remains low, but without current SI. patient is interacting with peers well and attending all groups. She is eating well and sleeping " okay". Patient appears to be beneiftitng from group and peer therapy.     Past Medical History:  Past Medical History:  Diagnosis Date  . Anxiety   . Depression     Past Surgical History:  Procedure Laterality Date  . APPENDECTOMY    . WISDOM TOOTH EXTRACTION     Family History: History reviewed. No pertinent family history.  Social History:  Social History   Substance and Sexual Activity  Alcohol Use Yes   Comment: Occasional Drinker     Social History   Substance and Sexual Activity  Drug Use Never    Social History   Socioeconomic History  . Marital status: Single    Spouse name: Not on file  . Number of children: Not on file  . Years of education: Not on file  . Highest education level: Not on file  Occupational History  . Not on file  Tobacco Use  . Smoking status: Never Smoker  . Smokeless tobacco: Never Used  Substance and Sexual Activity  . Alcohol use: Yes    Comment: Occasional Drinker  . Drug use: Never  . Sexual activity: Not Currently  Other Topics Concern  . Not on file  Social History Narrative  . Not on file   Social Determinants of Health   Financial Resource Strain: Not on file  Food Insecurity: Not on file  Transportation Needs: Not on file  Physical Activity: Not on file  Stress: Not on file  Social Connections: Not on file   Additional Social History:                          Sleep: Fair  Appetite:  Fair  Current Medications: Current Facility-Administered Medications  Medication Dose Route Frequency Provider Last Rate Last Admin  . acetaminophen (TYLENOL) tablet 650 mg  650 mg Oral Q6H PRN Antonieta Pert, MD      . alum & mag hydroxide-simeth (MAALOX/MYLANTA) 200-200-20 MG/5ML suspension 30 mL  30 mL Oral Q4H PRN Antonieta Pert, MD      . ARIPiprazole (ABILIFY) tablet 2 mg  2 mg Oral QHS Donneisha Beane, Worthy Rancher, MD   2 mg at 09/22/20 2123  . FLUoxetine (PROZAC) capsule 40 mg  40 mg Oral QHS Nadim Malia A, MD   40 mg at 09/22/20 2123  . hydrOXYzine (ATARAX/VISTARIL) tablet 25 mg  25 mg Oral TID PRN Antonieta Pert, MD      . magnesium hydroxide (MILK OF MAGNESIA) suspension 30 mL  30 mL Oral Daily PRN Antonieta Pert, MD      . naphazoline-glycerin (CLEAR EYES REDNESS) ophth solution 1-2 drop  1-2 drop Both Eyes QID PRN Olive Zmuda, Worthy Rancher, MD      . traZODone (DESYREL) tablet 50 mg  50 mg Oral QHS PRN Antonieta Pert, MD   50 mg at 09/22/20 2123  . white petrolatum (VASELINE) gel  Lab Results: No results found for this or any previous visit (from the past 48 hour(s)).  Blood Alcohol level:  Lab Results  Component Value Date   ETH <10 09/21/2020    Metabolic Disorder Labs: No results found for: HGBA1C, MPG No results found for: PROLACTIN No results found for: CHOL, TRIG, HDL, CHOLHDL, VLDL, LDLCALC  Physical Findings: AIMS:  , ,  ,  ,    CIWA:    COWS:     Musculoskeletal: Strength & Muscle Tone: within normal limits Gait & Station: normal Patient leans: N/A  Psychiatric Specialty Exam: Physical Exam  Review of Systems  Blood pressure 111/73, pulse 85, temperature 98 F (36.7 C), temperature source Oral, resp. rate 17, height 5\' 8"  (1.727 m), weight 66.7 kg, SpO2 100 %.Body mass index is 22.35 kg/m.  General Appearance: Casual  Eye Contact:  Fair  Speech:  Clear and Coherent  Volume:  Normal  Mood:   Depressed  Affect:  Appropriate  Thought Process:  Coherent  Orientation:  Full (Time, Place, and Person)  Thought Content:  Logical  Suicidal Thoughts:  No  Homicidal Thoughts:  No  Memory:  Recent;   Fair  Judgement:  Fair  Insight:  Fair  Psychomotor Activity:  Normal  Concentration:  Concentration: Fair  Recall:  of Knowledge:  Fair  Language:  Fair  Akathisia:  No  Handed:  Right  AIMS (if indicated):     Assets:  Desire for Improvement Housing Leisure Time Resilience Social Support Transportation  ADL's:  Intact  Cognition:  WNL  Sleep:  Number of Hours: 6.75     Treatment Plan Summary: Daily contact with patient to assess and evaluate symptoms and progress in treatment   Continue Prozac 40mg  PO QHS Continue Abilify 2mg  PO QHS Continue trazodone 50mg  PO QHS PRN  [employer], MD 09/23/2020, 1:37 PM

## 2020-09-23 NOTE — BHH Counselor (Signed)
Adult Comprehensive Assessment  Patient ID: Cathy Garner, female   DOB: 09/01/2000, 20 y.o.   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 Family Relationships: Pt reports no stressors Surveyor, quantity / Lack of resources (include bankruptcy): Pt reports no stressors Housing / Lack of housing: Pt reports living in a dorm room on campus Physical health (include injuries & life threatening diseases): Pt reports no stressors Social relationships: Pt reports no stressors Substance abuse: Pt reports using [employer]iables once last year Bereavement / Loss: Pt reports no stressors  Living/Environment/Situation:  Living Arrangements: Alone Living conditions (as described by patient or guardian): "I like it there a lot, plus I get to be alone" Who else lives in the home?: No one How long has patient lived in current situation?: 6 months What is atmosphere in current home: Comfortable  Family History:  Marital status: Single Are you sexually active?: No What is your sexual orientation?: Heterosexual Has your sexual activity been affected by drugs, alcohol, medication, or emotional stress?: No Does patient have children?: No  Childhood History:  By whom was/is the patient raised?: Both parents Additional childhood history information: None Description of patient's relationship with caregiver when they were a child: "We all got along really well" Patient's description of current relationship with people who raised  him/her: "It is still the same now" How were you disciplined when you got in trouble as a child/adolescent?: Groundings Does patient have siblings?: Yes Number of Siblings: 1 Description of patient's current relationship with siblings: "I have one younger brother who was adopted from [place]" Did patient suffer any verbal/emotional/physical/sexual abuse as a child?: No Did patient suffer from severe childhood neglect?: No Has patient ever been sexually abused/assaulted/raped as an adolescent or adult?: No Was the patient ever a victim of a crime or a disaster?: No Witnessed domestic violence?: No Has patient been affected by domestic violence as an adult?: No  Education:  Highest grade of school patient has completed: 12th grade Currently a student?: Yes Name of school: [employer] in Psychology, Neuroscience, and Biology How long has the patient attended?: 6 months, Freshman Learning disability?: No  Employment/Work Situation:   Employment situation: Unemployed (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) Patient's job has been impacted by current illness: No What is the longest time patient has a held a job?: 1 year Where was the patient employed at that time?: [employer] Has patient ever been in the [employer]?: No  Financial Resources:   Surveyor, quantity resources: Media planner Does patient have a Lawyer or guardian?: No  Alcohol/Substance Abuse:   What has been your use of drugs/alcohol within the last 12 months?: Pt reports using [employer] once last year If attempted suicide, did drugs/alcohol play a role in this?: No Alcohol/Substance Abuse Treatment Hx: Denies past history Has alcohol/substance abuse ever caused legal problems?: No  Social Support System:   Forensic psychologist System: Production assistant, radio System: Family, friends, Banker, professors, and friends parent's Type of faith/religion:  None How  does patient's faith help to cope with current illness?: None  Leisure/Recreation:   Do You Have Hobbies?: Yes Leisure and Hobbies: Being outside, reading, crafts, listening to music, working out, playing tennis  Strengths/Needs:   What is the patient's perception of their strengths?: Being social, making friends, and advocating for myself Patient states they can use these personal strengths during their treatment to contribute to their recovery: "To learn to ask for help when I need it" Patient states these barriers may affect/interfere with their treatment: None Patient states these barriers may affect their return to the community: None  Discharge Plan:   Currently receiving community mental health services: Yes (From Whom) ([facility] for therapy) Patient states concerns and preferences for aftercare planning are: Pt would like to have a psychiatrist and keep her same therapist Patient states they will know when they are safe and ready for discharge when: "When the doctor says I can leave" Does patient have access to transportation?: Yes (Own car and friends) Does patient have financial barriers related to discharge medications?: No Patient description of barriers related to discharge medications: None Will patient be returning to same living situation after discharge?: Yes  Summary/Recommendations:   Summary and Recommendations (to be completed by the evaluator): Cathy Garner is a 20 year old, Caucasian, female who was admitted to the hospital due to suicidal thoughts and taking 5 Trazodone pills.  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  The Pt reports living in a dorm room alone.  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  The Pt denies all substance use and states that she has many supports with  family, friends, and her community.  The Pt reports having her own transportation.  The Pt denies all other stressors at this time.  While in the hospital the Pt can benefit from crisis stabilization, medication management, group therapy, psycho-education, case management, and discharge planning.  Upon discharge the Pt will follow up with [facility] for therapy and with [facility] for medication management.  Aram Beecham. 09/23/2020

## 2020-09-23 NOTE — Progress Notes (Signed)
Patient refused eye drops which were ordered for her today.

## 2020-09-24 LAB — TSH: TSH: 1.35 u[IU]/mL (ref 0.350–4.500)

## 2020-09-24 LAB — LIPID PANEL
Cholesterol: 130 mg/dL (ref 0–200)
HDL: 35 mg/dL — ABNORMAL LOW (ref >40)
LDL Cholesterol: 72 mg/dL (ref 0–99)
Total CHOL/HDL Ratio: 3.7 RATIO
Triglycerides: 117 mg/dL (ref <150)
VLDL: 23 mg/dL (ref 0–40)

## 2020-09-24 LAB — HEMOGLOBIN A1C
Hgb A1c MFr Bld: 4.5 % — ABNORMAL LOW (ref 4.8–5.6)
Mean Plasma Glucose: 82.45 mg/dL

## 2020-09-24 MED ORDER — ARIPIPRAZOLE 5 MG PO TABS
5.0000 mg | ORAL_TABLET | Freq: Every day | ORAL | Status: DC
  Administered 2020-09-24 – 2020-09-25 (×2): 5 mg via ORAL
  Filled 2020-09-24 (×3): qty 1

## 2020-09-24 NOTE — Progress Notes (Signed)
Patient ID: Cathy Garner, female   DOB: 2001-03-25, 20 y.o.   MRN: [id number]  [facility] MD Progress Note  09/24/2020 2:47 PM Cass Edinger  MRN:  [id number] Subjective:  20 year old female with history of MDD who presented following episode of SI  Principal Problem: <principal problem not specified> Diagnosis: Active Problems:   Major depression  Total Time spent with patient: 20 minutes    CC" Good afternoon"  Patient reports having a good night. She denies any adverse effects of the medication and is agreeable to an increased dose of Abilify to target mood instability. Patient still has episodes of mood swings with highs and lows. She reports benefit from group therapy and is contributing meaningfully.  She is eating well and sleeping " okay" with the help of PRN trazodone. Patient appears to be beneiftitng from medication changes and therapeutic interventions.     Past Medical History:  Past Medical History:  Diagnosis Date  . Anxiety   . Depression     Past Surgical History:  Procedure Laterality Date  . APPENDECTOMY    . WISDOM TOOTH EXTRACTION     Family History: History reviewed. No pertinent family history.  Social History:  Social History   Substance and Sexual Activity  Alcohol Use Yes   Comment: Occasional Drinker     Social History   Substance and Sexual Activity  Drug Use Never    Social History   Socioeconomic History  . Marital status: Single    Spouse name: Not on file  . Number of children: Not on file  . Years of education: Not on file  . Highest education level: Not on file  Occupational History  . Not on file  Tobacco Use  . Smoking status: Never Smoker  . Smokeless tobacco: Never Used  Substance and Sexual Activity  . Alcohol use: Yes    Comment: Occasional Drinker  . Drug use: Never  . Sexual activity: Not Currently  Other Topics Concern  . Not on file  Social History Narrative  . Not on file   Social Determinants of Health    Financial Resource Strain: Not on file  Food Insecurity: Not on file  Transportation Needs: Not on file  Physical Activity: Not on file  Stress: Not on file  Social Connections: Not on file   Additional Social History:                         Sleep: Fair  Appetite:  Fair  Current Medications: Current Facility-Administered Medications  Medication Dose Route Frequency Provider Last Rate Last Admin  . acetaminophen (TYLENOL) tablet 650 mg  650 mg Oral Q6H PRN Antonieta Pert, MD      . alum & mag hydroxide-simeth (MAALOX/MYLANTA) 200-200-20 MG/5ML suspension 30 mL  30 mL Oral Q4H PRN Antonieta Pert, MD      . ARIPiprazole (ABILIFY) tablet 5 mg  5 mg Oral QHS Kinney Sackmann A, MD      . FLUoxetine (PROZAC) capsule 40 mg  40 mg Oral QHS Sequoya Hogsett A, MD   40 mg at 09/23/20 2114  . hydrOXYzine (ATARAX/VISTARIL) tablet 25 mg  25 mg Oral TID PRN Antonieta Pert, MD      . magnesium hydroxide (MILK OF MAGNESIA) suspension 30 mL  30 mL Oral Daily PRN Antonieta Pert, MD      . naphazoline-glycerin (CLEAR EYES REDNESS) ophth solution 1-2 drop  1-2 drop Both Eyes QID  PRN Joyice Magda, Worthy Rancher, MD      . traZODone (DESYREL) tablet 50 mg  50 mg Oral QHS PRN Antonieta Pert, MD   50 mg at 09/23/20 2115    Lab Results:  Results for orders placed or performed during the hospital encounter of 09/22/20 (from the past 48 hour(s))  TSH     Status: None   Collection Time: 09/24/20  6:20 AM  Result Value Ref Range   TSH 1.350 0.350 - 4.500 uIU/mL    Comment: Performed by a 3rd Generation assay with a functional sensitivity of <=0.01 uIU/mL. Performed at [facility], [address] [place], [place] [address]   Hemoglobin A1c     Status: Abnormal   Collection Time: 09/24/20  6:20 AM  Result Value Ref Range   Hgb A1c MFr Bld 4.5 (L) 4.8 - 5.6 %    Comment: (NOTE) Pre diabetes:          5.7%-6.4%  Diabetes:              >6.4%  Glycemic control  for   <7.0% adults with diabetes    Mean Plasma Glucose 82.45 mg/dL    Comment: Performed at [facility], [address], [place], [place] [address]  Lipid panel     Status: Abnormal   Collection Time: 09/24/20  6:20 AM  Result Value Ref Range   Cholesterol 130 0 - 200 mg/dL   Triglycerides 326 <712 mg/dL   HDL 35 (L) >45 mg/dL   Total CHOL/HDL Ratio 3.7 RATIO   VLDL 23 0 - 40 mg/dL   LDL Cholesterol 72 0 - 99 mg/dL    Comment:        Total Cholesterol/HDL:CHD Risk Coronary Heart Disease Risk Table                     Men   Women  1/2 Average Risk   3.4   3.3  Average Risk       5.0   4.4  2 X Average Risk   9.6   7.1  3 X Average Risk  23.4   11.0        Use the calculated Patient Ratio above and the CHD Risk Table to determine the patient's CHD Risk.        ATP III CLASSIFICATION (LDL):  <100     mg/dL   Optimal  809-983  mg/dL   Near or Above                    Optimal  130-159  mg/dL   Borderline  382-505  mg/dL   High  >397     mg/dL   Very High Performed at [facility], [address] [place], [place] [address]     Blood Alcohol level:  Lab Results  Component Value Date   ETH <10 09/21/2020    Metabolic Disorder Labs: Lab Results  Component Value Date   HGBA1C 4.5 (L) 09/24/2020   MPG 82.45 09/24/2020   No results found for: PROLACTIN Lab Results  Component Value Date   CHOL 130 09/24/2020   TRIG 117 09/24/2020   HDL 35 (L) 09/24/2020   CHOLHDL 3.7 09/24/2020   VLDL 23 09/24/2020   LDLCALC 72 09/24/2020    Physical Findings: AIMS:  , ,  ,  ,    CIWA:    COWS:     Musculoskeletal: Strength & Muscle  Tone: within normal limits Gait & Station: normal Patient leans: N/A  Psychiatric Specialty Exam: Physical Exam  Review of Systems  Blood pressure 123/61, pulse 74, temperature (!) 97.5 F (36.4 C), temperature source Oral, resp. rate 16, height 5\' 8"  (1.727 m), weight 66.7 kg, SpO2 100 %.Body mass index is 22.35  kg/m.  General Appearance: Casual  Eye Contact:  Fair  Speech:  Clear and Coherent  Volume:  Normal  Mood:  Depressed, improving  Affect:  Appropriate  Thought Process:  Coherent  Orientation:  Full (Time, Place, and Person)  Thought Content:  Logical  Suicidal Thoughts:  No  Homicidal Thoughts:  No  Memory:  Recent;   Fair  Judgement:  Fair  Insight:  Fair  Psychomotor Activity:  Normal  Concentration:  Concentration: Fair  Recall:  of Knowledge:  Fair  Language:  Fair  Akathisia:  No  Handed:  Right  AIMS (if indicated):     Assets:  Desire for Improvement Housing Leisure Time Resilience Social Support Transportation  ADL's:  Intact  Cognition:  WNL  Sleep:  Number of Hours: 6.75     Treatment Plan Summary: Daily contact with patient to assess and evaluate symptoms and progress in treatment   20 year old female with history of MDD presenting with recurrence of symptoms of depression and SI following a suicidal gesture. Patient has had Abilify added to her regimen and is reporting incremental improvement in her mood.  Continue Prozac 40mg  PO QHS Increase Abilify to 5mg  PO QHS Continue trazodone 50mg  PO QHS PRN  12, MD 09/24/2020, 2:47 PM

## 2020-09-24 NOTE — Tx Team (Signed)
Interdisciplinary Treatment and Diagnostic Plan Update  09/24/2020 Time of Session: 9:05am Cathy Garner MRN: [id number]  Principal Diagnosis: <principal problem not specified>  Secondary Diagnoses: Active Problems:   Major depression   Current Medications:  Current Facility-Administered Medications  Medication Dose Route Frequency Provider Last Rate Last Admin  . acetaminophen (TYLENOL) tablet 650 mg  650 mg Oral Q6H PRN Sharma Covert, MD      . alum & mag hydroxide-simeth (MAALOX/MYLANTA) 200-200-20 MG/5ML suspension 30 mL  30 mL Oral Q4H PRN Sharma Covert, MD      . ARIPiprazole (ABILIFY) tablet 2 mg  2 mg Oral QHS Cristofano, Dorene Ar, MD   2 mg at 09/23/20 2115  . FLUoxetine (PROZAC) capsule 40 mg  40 mg Oral QHS Cristofano, Paul A, MD   40 mg at 09/23/20 2114  . hydrOXYzine (ATARAX/VISTARIL) tablet 25 mg  25 mg Oral TID PRN Sharma Covert, MD      . magnesium hydroxide (MILK OF MAGNESIA) suspension 30 mL  30 mL Oral Daily PRN Sharma Covert, MD      . naphazoline-glycerin (CLEAR EYES REDNESS) ophth solution 1-2 drop  1-2 drop Both Eyes QID PRN Cristofano, Dorene Ar, MD      . traZODone (DESYREL) tablet 50 mg  50 mg Oral QHS PRN Sharma Covert, MD   50 mg at 09/23/20 2115   PTA Medications: Medications Prior to Admission  Medication Sig Dispense Refill Last Dose  . acetaminophen (TYLENOL) 325 MG tablet Take 650 mg by mouth every 6 (six) hours as needed for mild pain, moderate pain, fever or headache.     Marland Kitchen FLUoxetine (PROZAC) 40 MG capsule Take 40 mg by mouth daily.     . traZODone (DESYREL) 50 MG tablet Take 75 mg by mouth at bedtime as needed for sleep.      Marland Kitchen triamcinolone (KENALOG) 0.025 % ointment Apply 1 application topically 2 (two) times daily as needed. Itching       Patient Stressors: Educational concerns  Patient Strengths: Capable of independent living Curator fund of knowledge Motivation for treatment/growth  Treatment  Modalities: Medication Management, Group therapy, Case management,  1 to 1 session with clinician, Psychoeducation, Recreational therapy.   Physician Treatment Plan for Primary Diagnosis: <principal problem not specified> Long Term Goal(s): Improvement in symptoms so as ready for discharge Improvement in symptoms so as ready for discharge   Short Term Goals: Ability to identify changes in lifestyle to reduce recurrence of condition will improve Ability to verbalize feelings will improve Ability to disclose and discuss suicidal ideas Ability to demonstrate self-control will improve Ability to identify and develop effective coping behaviors will improve Ability to maintain clinical measurements within normal limits will improve Compliance with prescribed medications will improve Ability to identify triggers associated with substance abuse/mental health issues will improve Ability to identify changes in lifestyle to reduce recurrence of condition will improve Ability to verbalize feelings will improve Ability to disclose and discuss suicidal ideas Ability to demonstrate self-control will improve Ability to identify and develop effective coping behaviors will improve Ability to maintain clinical measurements within normal limits will improve Compliance with prescribed medications will improve Ability to identify triggers associated with substance abuse/mental health issues will improve  Medication Management: Evaluate patient's response, side effects, and tolerance of medication regimen.  Therapeutic Interventions: 1 to 1 sessions, Unit Group sessions and Medication administration.  Evaluation of Outcomes: Not Met  Physician Treatment Plan for Secondary Diagnosis: Active Problems:  Major depression  Long Term Goal(s): Improvement in symptoms so as ready for discharge Improvement in symptoms so as ready for discharge   Short Term Goals: Ability to identify changes in lifestyle to  reduce recurrence of condition will improve Ability to verbalize feelings will improve Ability to disclose and discuss suicidal ideas Ability to demonstrate self-control will improve Ability to identify and develop effective coping behaviors will improve Ability to maintain clinical measurements within normal limits will improve Compliance with prescribed medications will improve Ability to identify triggers associated with substance abuse/mental health issues will improve Ability to identify changes in lifestyle to reduce recurrence of condition will improve Ability to verbalize feelings will improve Ability to disclose and discuss suicidal ideas Ability to demonstrate self-control will improve Ability to identify and develop effective coping behaviors will improve Ability to maintain clinical measurements within normal limits will improve Compliance with prescribed medications will improve Ability to identify triggers associated with substance abuse/mental health issues will improve     Medication Management: Evaluate patient's response, side effects, and tolerance of medication regimen.  Therapeutic Interventions: 1 to 1 sessions, Unit Group sessions and Medication administration.  Evaluation of Outcomes: Not Met   RN Treatment Plan for Primary Diagnosis: <principal problem not specified> Long Term Goal(s): Knowledge of disease and therapeutic regimen to maintain health will improve  Short Term Goals: Ability to participate in decision making will improve, Ability to verbalize feelings will improve and Ability to identify and develop effective coping behaviors will improve  Medication Management: RN will administer medications as ordered by provider, will assess and evaluate patient's response and provide education to patient for prescribed medication. RN will report any adverse and/or side effects to prescribing provider.  Therapeutic Interventions: 1 on 1 counseling sessions,  Psychoeducation, Medication administration, Evaluate responses to treatment, Monitor vital signs and CBGs as ordered, Perform/monitor CIWA, COWS, AIMS and Fall Risk screenings as ordered, Perform wound care treatments as ordered.  Evaluation of Outcomes: Not Met   LCSW Treatment Plan for Primary Diagnosis: <principal problem not specified> Long Term Goal(s): Safe transition to appropriate next level of care at discharge, Engage patient in therapeutic group addressing interpersonal concerns.  Short Term Goals: Engage patient in aftercare planning with referrals and resources, Increase social support and Increase ability to appropriately verbalize feelings  Therapeutic Interventions: Assess for all discharge needs, 1 to 1 time with Social worker, Explore available resources and support systems, Assess for adequacy in community support network, Educate family and significant other(s) on suicide prevention, Complete Psychosocial Assessment, Interpersonal group therapy.  Evaluation of Outcomes: Not Met   Progress in Treatment: Attending groups: Yes. and No. Participating in groups: No. Taking medication as prescribed: Yes. Toleration medication: Yes. Family/Significant other contact made: No, will contact:  pt declined consents Patient understands diagnosis: Yes. Discussing patient identified problems/goals with staff: Yes. Medical problems stabilized or resolved: Yes. Denies suicidal/homicidal ideation: Yes. Issues/concerns per patient self-inventory: No. Other: None  New problem(s) identified: No, Describe:  CSW will continue to assess  New Short Term/Long Term Goal(s):medication stabilization, elimination of SI thoughts, development of comprehensive mental wellness plan.  Patient Goals:  "to continue to be active and social."  Discharge Plan or Barriers: Patient recently admitted. CSW will continue to follow and assess for appropriate referrals and possible discharge  planning.  Reason for Continuation of Hospitalization: Depression Medication stabilization Suicidal ideation  Estimated Length of Stay: 3-5 days  Attendees: Patient: Cathy Garner 09/24/2020   Physician: Lala Lund, MD 09/24/2020  Nursing:  09/24/2020   RN Care Manager: 09/24/2020   Social Worker: Toney Reil, Latanya Presser 09/24/2020   Recreational Therapist:  09/24/2020   Other:  09/24/2020   Other:  09/24/2020   Other: 09/24/2020     Scribe for Treatment Team: Mliss Fritz, Latanya Presser 09/24/2020 10:13 AM

## 2020-09-24 NOTE — Progress Notes (Signed)
Adult Psychoeducational Group Note  Date:  09/24/2020 Time:  9:30 AM  Group Topic/Focus:  Goals Group:   The focus of this group is to help patients establish daily goals to achieve during treatment and discuss how the patient can incorporate goal setting into their daily lives to aide in recovery.  Participation Level:  Active  Participation Quality:  Appropriate  Affect:  Appropriate  Cognitive:  Appropriate  Insight: Appropriate  Engagement in Group:  Engaged  Modes of Intervention:  Discussion  Additional Comments:  Pt attended group and participated in discussion.  Tyra Michelle R Wray Goehring 09/24/2020, 9:30 AM

## 2020-09-24 NOTE — Progress Notes (Signed)
Patient denies SI/HI. She denies having any depression or anxiety tonight. She reported that she's been able to release stress, and enjoys journaling and exercising.   Medications reviewed with pt. Verbal support provided. 15 minute checks performed for safety.   Patient compliant with treatment plan.

## 2020-09-24 NOTE — Progress Notes (Signed)
Adult Psychoeducational Group Note  Date:  09/24/2020 Time:  5:44 PM  Group Topic/Focus:  support systems  Participation Level:  Active  Participation Quality:  Appropriate  Affect:  Appropriate  Cognitive:  Appropriate  Insight: Appropriate  Engagement in Group:  Engaged  Modes of Intervention:  Discussion  Additional Comments:  Pt attended group and participated in discussion led by RN.  Jackie Littlejohn R Ilsa Bonello 09/24/2020, 5:44 PM

## 2020-09-24 NOTE — Progress Notes (Addendum)
Recreation Therapy Notes  Date: 2.25.22 Time: 0930 Location: [address] Dayroom  Group Topic: Stress Management  Goal Area(s) Addresses:  Patient will identify positive stress management techniques. Patient will identify benefits of using stress management post d/c.  Behavioral Response:  Engaged  Intervention: Stress Management  Activity:  Progressive Muscle Relaxation.  LRT read a script that lead the group in tensing and then relaxing each muscle group individually.  Patients were to follow as script was read to engage in move to gain as much looseness in the muscles as possible.    Education: Stress Management, Discharge Planning.   Education Outcome: Acknowledges Education  Clinical Observations/Feedback: Pt attended and participated in group session.    Rever Pichette, LRT/CTRS        Kahlan Engebretson A 09/24/2020 11:18 AM 

## 2020-09-24 NOTE — [facility] Group Notes (Signed)
Type of Therapy and Topic: Group Therapy: Gratitude  Description of Group: The purpose behind this group is to get people thinking about things for which  they can be grateful. If continued over time, they might begin to spontaneously look for things and  situations for which to be grateful. Gratitude is related to a "wide variety of forms of wellbeing , whereas "negative attributions" can adversely affect relationships.  Several studies have shown that interventions to  increase gratitude can impact areas such as overall life satisfaction, decreased negative affect, increased happiness, the ability to provide emotional support to others, and decreased worrying.   Therapeutic Goals: 1. Patient will learn activities that focus on gratitude in their daily lives. 2. Patient will share gratitude in their daily lives. 3. Patient will learn to develop healthy habits and positive thinking techniques. 4. Patient will receive support and feedback from others  Pt received packet for group, followed along, was attentive and interactive. Pt shared that they are grateful for their family during introductions. Pt shared "I am grateful for who because I am a hardworker.".  Therapeutic Modalities: Cognitive Behavioral Therapy Solution Focused Therapy Motivational Interviewing

## 2020-09-24 NOTE — Progress Notes (Signed)
Pt visible in dayroom majority of this shift. Observed interacting well with peers and engaged in unit activities. Rates her anxiety 3/10, depression 2/10 and hopelessness 1/10. Current stressor include "Idea of a discharge plan and just back to school". Pt's goal for today is "To be more social". Pt reports her appetite and she slept well last night but states "I think my Prozac is keeping me up at night. I will talk to my doctor".  Emotional support, reassurance and encouragement offered to pt throughout this shift. Q 15 minutes safety checks maintained without self harm gestures or outburst.  Pt denies concerns at this time. Remains safe on and off unit.

## 2020-09-25 NOTE — Progress Notes (Signed)
   09/25/20 0039  Psych Admission Type (Psych Patients Only)  Admission Status Involuntary  Psychosocial Assessment  Patient Complaints Anxiety  Eye Contact Fair  Facial Expression Anxious  Affect Appropriate to circumstance  Speech Logical/coherent  Interaction Assertive  Motor Activity Fidgety  Appearance/Hygiene Unremarkable  Behavior Characteristics Cooperative  Mood Pleasant  Aggressive Behavior  Effect No apparent injury  Thought Process  Coherency WDL  Content WDL  Delusions None reported or observed  Perception WDL  Hallucination None reported or observed  Judgment WDL  Confusion None  Danger to Self  Current suicidal ideation? Denies  Danger to Others  Danger to Others None reported or observed

## 2020-09-25 NOTE — Progress Notes (Signed)
The focus of this group is to help patients review their daily goal of treatment and discuss progress on daily workbooks. Pt attended the evening group session and responded to all discussion prompts from the Writer. Pt shared that today was a good day on the unit, the highlight of which was looking forward to her coming discharge.  Cathy Garner shared that her goal for the coming week was to get her schoolwork caught up - and also to navigate her social life as her peers have become aware of [name]'s situation and hospitalization. Pt was encouraged to share only what she was comfortable sharing with whom she trusts among her peers as this is an ongoing stressor.  Pt's affect was appropriate and she was observed interacting with her peers in the dayroom.

## 2020-09-25 NOTE — Progress Notes (Signed)
Patient rates depression 0/10 and anxiety 0/10 (10 being worst). She denies SI/HI. She reports fair sleep at night and reports having a fair appetite. She verbalizes readiness to discharge tomorrow. She was asked if she felt comfortable with [employer] nursing students being on the [facility] and she stated that it was okay for the students to be present.   Orders reviewed. Vital signs reviewed. Verbal support provided. 15 minute checks performed for safety.   Patient compliant with treatment pan.

## 2020-09-25 NOTE — [facility] Group Notes (Signed)
LCSW Group Therapy Note  09/25/2020 11:55 AM  Type of Therapy and Topic:  Group Therapy:  Feelings around Relapse and Recovery  Participation Level:  Active   Description of Group:    Patients in this group will discuss emotions they experience before and after a relapse. They will process how experiencing these feelings, or avoidance of experiencing them, relates to having a relapse. Facilitator will guide patients to explore emotions they have related to recovery. Patients will be encouraged to process which emotions are more powerful. They will be guided to discuss the emotional reaction significant others in their lives may have to their relapse or recovery. Patients will be assisted in exploring ways to respond to the emotions of others without this contributing to a relapse.  Therapeutic Goals: 1. Patient will identify two or more emotions that lead to a relapse for them 2. Patient will identify two emotions that result when they relapse 3. Patient will identify two emotions related to recovery 4. Patient will demonstrate ability to communicate their needs through discussion and/or role plays   Summary of Patient Progress: Patient was present for the entirety of the group session. Patient was an active listener and participated in the topic of discussion, provided helpful advice to others, and added nuance to topic of conversation. Patient identified stress as a major trigger to presenting problem. During closing remarks, patient shared that she appreciated hearing other perspectives.    Therapeutic Modalities:   Cognitive Behavioral Therapy Solution-Focused Therapy Assertiveness Training Relapse Prevention Therapy   Gwenevere Ghazi, MSW, [place], [place] 09/25/2020 11:55 AM

## 2020-09-25 NOTE — Progress Notes (Signed)
Patient ID: Cathy DawleyDelaney Garner, female   DOB: 04/03/2001, 20 y.o.   MRN: [id number]  [facility]  09/25/2020 2:00 PM Cathy DawleyDelaney Garner  MRN:  [id number]  Subjective: Cathy Garner reports, "I'm feeling really good today. I'm not having any symptoms of depression or anxiety. I'm ready to be discharged back to school".  Objective: Cathy Garner a 20 y.o.femalepatient [facility] Per the ED triage nurse note,Pt reports taking approximately 5 Trazodone since 2130. When asked if this was to harm self, pt responds "yes." Pt reports she is still actively having SI thoughts. Pt with hx/o mental health disorders and in-patient placement. Daily notes: Cathy Garner is seen, chart reviewed. The chart findings discussed with the treatment team. She presents alert, oriented x 4. She is visible on the unit, attending group sessions. She reports having a good day so far. She is taking & tolerating her treatment regimen. She denies any adverse effects from her medications. She denies any symptoms of depression, anxiety or mood instability. She denies any new issues or concerns.. She reports benefitting from group therapy and is contributing meaningfully.  She is eating well and sleeping well with the help of PRN trazodone. Patient appears to be beneiftitng from medications and therapeutic interventions. She currently denies any SIHI, AVH, delusional thoughts or paranoia. She does not appear to be responding to any internal stimuli. She is in agreement to continue current plan of care as already in progress. She has asked to be discharged in the morning as she feels she is doing well.  Principal Problem: MDD (major depressive disorder), recurrent episode, severe (HCC)  Diagnosis: Principal Problem:   MDD (major depressive disorder), recurrent episode, severe (HCC) Active Problems:   Major depression  Total Time spent with patient: 15 minutes   Past Medical  History:  Past Medical History:  Diagnosis Date  . Anxiety   . Depression     Past Surgical History:  Procedure Laterality Date  . APPENDECTOMY    . WISDOM TOOTH EXTRACTION     Family History: History reviewed. No pertinent family history.  Social History:  Social History   Substance and Sexual Activity  Alcohol Use Yes   Comment: Occasional Drinker     Social History   Substance and Sexual Activity  Drug Use Never    Social History   Socioeconomic History  . Marital status: Single    Spouse name: Not on file  . Number of children: Not on file  . Years of education: Not on file  . Highest education level: Not on file  Occupational History  . Not on file  Tobacco Use  . Smoking status: Never Smoker  . Smokeless tobacco: Never Used  Substance and Sexual Activity  . Alcohol use: Yes    Comment: Occasional Drinker  . Drug use: Never  . Sexual activity: Not Currently  Other Topics Concern  . Not on file  Social History Narrative  . Not on file   Social Determinants of Health   Financial Resource Strain: Not on file  Food Insecurity: Not on file  Transportation Needs: Not on file  Physical Activity: Not on file  Stress: Not on file  Social Connections: Not on file   Additional Social History:   Sleep: Good  Appetite:  Good  Current Medications: Current Facility-Administered Medications  Medication Dose Route Frequency Provider Last Rate Last Admin  . acetaminophen (TYLENOL) tablet 650 mg  650 mg Oral Q6H PRN Antonieta Pertlary, Greg Lawson,  MD      . alum & mag hydroxide-simeth (MAALOX/MYLANTA) 200-200-20 MG/5ML suspension 30 mL  30 mL Oral Q4H PRN Antonieta Pert, MD      . ARIPiprazole (ABILIFY) tablet 5 mg  5 mg Oral QHS Cristofano, Worthy Rancher, MD   5 mg at 09/24/20 2104  . FLUoxetine (PROZAC) capsule 40 mg  40 mg Oral QHS Cristofano, Worthy Rancher, MD   40 mg at 09/24/20 2104  . hydrOXYzine (ATARAX/VISTARIL) tablet 25 mg  25 mg Oral TID PRN Antonieta Pert, MD       . magnesium hydroxide (MILK OF MAGNESIA) suspension 30 mL  30 mL Oral Daily PRN Antonieta Pert, MD      . naphazoline-glycerin (CLEAR EYES REDNESS) ophth solution 1-2 drop  1-2 drop Both Eyes QID PRN Cristofano, Worthy Rancher, MD      . traZODone (DESYREL) tablet 50 mg  50 mg Oral QHS PRN Antonieta Pert, MD   50 mg at 09/23/20 2115   Lab Results:  Results for orders placed or performed during the hospital encounter of 09/22/20 (from the past 48 hour(s))  TSH     Status: None   Collection Time: 09/24/20  6:20 AM  Result Value Ref Range   TSH 1.350 0.350 - 4.500 uIU/mL    Comment: Performed by a 3rd Generation assay with a functional sensitivity of <=0.01 uIU/mL. Performed at [facility], [address] [place], [place] [address]   Hemoglobin A1c     Status: Abnormal   Collection Time: 09/24/20  6:20 AM  Result Value Ref Range   Hgb A1c MFr Bld 4.5 (L) 4.8 - 5.6 %    Comment: (NOTE) Pre diabetes:          5.7%-6.4%  Diabetes:              >6.4%  Glycemic control for   <7.0% adults with diabetes    Mean Plasma Glucose 82.45 mg/dL    Comment: Performed at [facility], [address], [place], [place] [address]  Lipid panel     Status: Abnormal   Collection Time: 09/24/20  6:20 AM  Result Value Ref Range   Cholesterol 130 0 - 200 mg/dL   Triglycerides 856 <314 mg/dL   HDL 35 (L) >97 mg/dL   Total CHOL/HDL Ratio 3.7 RATIO   VLDL 23 0 - 40 mg/dL   LDL Cholesterol 72 0 - 99 mg/dL    Comment:        Total Cholesterol/HDL:CHD Risk Coronary Heart Disease Risk Table                     Men   Women  1/2 Average Risk   3.4   3.3  Average Risk       5.0   4.4  2 X Average Risk   9.6   7.1  3 X Average Risk  23.4   11.0        Use the calculated Patient Ratio above and the CHD Risk Table to determine the patient's CHD Risk.        ATP III CLASSIFICATION (LDL):  <100     mg/dL   Optimal  026-378  mg/dL   Near or Above                    Optimal   130-159  mg/dL   Borderline  588-502  mg/dL   High  >  190     mg/dL   Very High Performed at [facility], [address] [place], [place] [address]    Blood Alcohol level:  Lab Results  Component Value Date   ETH <10 09/21/2020   Metabolic Disorder Labs: Lab Results  Component Value Date   HGBA1C 4.5 (L) 09/24/2020   MPG 82.45 09/24/2020   No results found for: PROLACTIN Lab Results  Component Value Date   CHOL 130 09/24/2020   TRIG 117 09/24/2020   HDL 35 (L) 09/24/2020   CHOLHDL 3.7 09/24/2020   VLDL 23 09/24/2020   LDLCALC 72 09/24/2020   Physical Findings: AIMS:  , ,  ,  ,    CIWA:    COWS:     Musculoskeletal: Strength & Muscle Tone: within normal limits Gait & Station: normal Patient leans: N/A  Psychiatric Specialty Exam: Physical Exam Vitals and nursing note reviewed.  HENT:     Head: Normocephalic.     Nose: Nose normal.     Mouth/Throat:     Pharynx: Oropharynx is clear.  Eyes:     Pupils: Pupils are equal, round, and reactive to light.  Cardiovascular:     Rate and Rhythm: Normal rate.     Pulses: Normal pulses.  Pulmonary:     Effort: Pulmonary effort is normal.  Genitourinary:    Comments: Deferred Musculoskeletal:        General: Normal range of motion.     Cervical back: Normal range of motion.  Skin:    General: Skin is warm and dry.  Neurological:     General: No focal deficit present.     Mental Status: She is alert and oriented to person, place, and time. Mental status is at baseline.     Review of Systems  Constitutional: Negative for chills, diaphoresis and fever.  HENT: Negative for congestion, rhinorrhea, sneezing and sore throat.   Eyes: Negative for discharge.  Respiratory: Negative for cough, shortness of breath and wheezing.   Cardiovascular: Negative for chest pain and palpitations.  Gastrointestinal: Negative for diarrhea, nausea and vomiting.  Endocrine: Negative for cold intolerance.   Genitourinary: Negative for difficulty urinating.  Musculoskeletal: Negative for arthralgias and myalgias.  Skin: Negative.   Allergic/Immunologic: Negative for environmental allergies and food allergies.       Allergies: Red dye  Neurological: Negative for dizziness, tremors, seizures, syncope, facial asymmetry, speech difficulty, weakness, light-headedness, numbness and headaches.  Psychiatric/Behavioral: Positive for dysphoric mood (Stable). Negative for agitation, behavioral problems, confusion, decreased concentration, hallucinations, self-injury, sleep disturbance and suicidal ideas. The patient is not nervous/anxious and is not hyperactive.     Blood pressure 110/64, pulse 82, temperature 97.8 F (36.6 C), temperature source Oral, resp. rate 20, height 5\' 8"  (1.727 m), weight 66.7 kg, SpO2 100 %.Body mass index is 22.35 kg/m.  General Appearance: Casual  Eye Contact:  Fair  Speech:  Clear and Coherent  Volume:  Normal  Mood:  Euthymic  Affect:  Appropriate  Thought Process:  Coherent  Orientation:  Full (Time, Place, and Person)  Thought Content:  Logical  Suicidal Thoughts:  No  Homicidal Thoughts:  No  Memory:  Recent;   Fair  Judgement:  Fair  Insight:  Fair  Psychomotor Activity:  Normal  Concentration:  Concentration: Fair  Recall:  of Knowledge:  Fair  Language:  Fair  Akathisia:  No  Handed:  Right  AIMS (if indicated):     Assets:  Desire for  Improvement Housing Leisure Time Resilience Social Support Transportation  ADL's:  Intact  Cognition:  WNL  Sleep:  Number of Hours: 6.75   Treatment Plan Summary: Daily contact with patient to assess and evaluate symptoms and progress in treatment and Medication management. 20 year old female with history of MDD presenting with recurrence of symptoms of depression and SI following a suicidal gesture. Patient has had Abilify added to her regimen and is reporting incremental improvement in her  mood.  Continue inpatient hospitalization. Will continue today 09/25/2020 plan as below except where it is noted. May be a discharge tomorrow morning - 09-26-20.  Depression Continue Prozac 40 mg PO Q HS.  Mood control/adjunct to Prozac. Continue Abilify 5mg  PO Q HS.  Insomnia Continue trazodone 50 mg PO QHS PRN.  Other prn medications. Continue Tylenol 650 mg po Q 6 hrs for pain/fever. Continue Mylanta 30 ml po Q 4 hrs for indigestion. Continue MOM 30 ml po daily for constipation. Continue clear eyes 1-2 gtts to both eyes qid for irritations.  Encourage group sessions. Discharge disposition in progress.  , NP, PMHNP, FNP-BC. 09/25/2020, 2:00 PMPatient ID: 09/27/2020, female   DOB: 07-03-2001, 20 y.o.   MRN: [id number]

## 2020-09-26 MED ORDER — HYDROXYZINE HCL 25 MG PO TABS: 25.0000 mg | ORAL_TABLET | Freq: Three times a day (TID) | ORAL | 0 refills | Status: AC | PRN

## 2020-09-26 MED ORDER — NAPHAZOLINE-GLYCERIN 0.012-0.2 % OP SOLN: 1.0000 [drp] | Freq: Four times a day (QID) | OPHTHALMIC | 0 refills | Status: AC | PRN

## 2020-09-26 MED ORDER — TRAZODONE HCL 50 MG PO TABS: 50.0000 mg | ORAL_TABLET | Freq: Every evening | ORAL | 0 refills | Status: AC | PRN

## 2020-09-26 MED ORDER — FLUOXETINE HCL 40 MG PO CAPS: 40.0000 mg | ORAL_CAPSULE | Freq: Every day | ORAL | 0 refills | Status: AC

## 2020-09-26 MED ORDER — ARIPIPRAZOLE 5 MG PO TABS: 5.0000 mg | ORAL_TABLET | Freq: Every day | ORAL | 0 refills | Status: AC

## 2020-09-26 NOTE — Progress Notes (Signed)
   09/26/20 0108  COVID-19 Daily Checkoff  Have you had a fever (temp > 37.80C/100F)  in the past 24 hours?  No  If you have had runny nose, nasal congestion, sneezing in the past 24 hours, has it worsened? No  COVID-19 EXPOSURE  Have you traveled outside the state in the past 14 days? No  Have you been in contact with someone with a confirmed diagnosis of COVID-19 or PUI in the past 14 days without wearing appropriate PPE? No  Have you been living in the same home as a person with confirmed diagnosis of COVID-19 or a PUI (household contact)? No  Have you been diagnosed with COVID-19? No

## 2020-09-26 NOTE — Progress Notes (Signed)
Patient denies SI/HI. Patient received both written and verbal discharge instructions. Patient verbalized understanding of discharge instructions. Patient received an AVS, SRA, transitional record and prescriptions. All belongings from locker were returned to the pt. Patient was safely discharged to the lobby and picked up by her "best friend."

## 2020-09-26 NOTE — Progress Notes (Signed)
   09/26/20 0112  Psych Admission Type (Psych Patients Only)  Admission Status Involuntary  Psychosocial Assessment  Patient Complaints None  Eye Contact Fair  Facial Expression Anxious  Affect Appropriate to circumstance  Speech Logical/coherent  Interaction Assertive  Motor Activity Other (Comment) (WDL)  Appearance/Hygiene Unremarkable  Behavior Characteristics Appropriate to situation  Mood Anxious;Pleasant  Thought Process  Coherency WDL  Content WDL  Delusions None reported or observed  Perception WDL  Hallucination None reported or observed  Judgment WDL  Confusion None  Danger to Self  Current suicidal ideation? Denies  Danger to Others  Danger to Others None reported or observed

## 2020-09-26 NOTE — Discharge Summary (Signed)
Physician Discharge Summary Note  Patient:  Cathy Garner is an 20 y.o., female MRN:  [id number] DOB:  [birth year] Patient phone:  [phone] (home)  Patient address:   [address] [place] [address],  Total Time spent with patient: Greater than 30 minutes  Date of Admission:  09/22/2020  Date of Discharge: 09-26-20  Reason for Admission: "I became overwhelmed and took about 5-6 Trazadone."  Principal Problem: MDD (major depressive disorder), recurrent episode, severe ([facility])  Discharge Diagnoses: Principal Problem:   MDD (major depressive disorder), recurrent episode, severe ([place]) Active Problems:   Major depression  Past Psychiatric History: Major depressive disorder.  Past Medical History:  Past Medical History:  Diagnosis Date  . Anxiety   . Depression     Past Surgical History:  Procedure Laterality Date  . APPENDECTOMY    . WISDOM TOOTH EXTRACTION     Family History: History reviewed. No pertinent family history.  Family Psychiatric  History: See H&P  Social History:  Social History   Substance and Sexual Activity  Alcohol Use Yes   Comment: Occasional Drinker     Social History   Substance and Sexual Activity  Drug Use Never    Social History   Socioeconomic History  . Marital status: Single    Spouse name: Not on file  . Number of children: Not on file  . Years of education: Not on file  . Highest education level: Not on file  Occupational History  . Not on file  Tobacco Use  . Smoking status: Never Smoker  . Smokeless tobacco: Never Used  Substance and Sexual Activity  . Alcohol use: Yes    Comment: Occasional Drinker  . Drug use: Never  . Sexual activity: Not Currently  Other Topics Concern  . Not on file  Social History Narrative  . Not on file   Social Determinants of Health   Financial Resource Strain: Not on file  Food Insecurity: Not on file  Transportation Needs: Not on file  Physical Activity: Not on file   Stress: Not on file  Social Connections: Not on file   Hospital Course: (Per Md's admission evaluation notes): "I became overwhelmed and took about 5-6 Trazadone." Jemimah McDowellis a 20 y.o.femalepatient presented to [facility] involuntary commitment status (IVC). Per the ED triage nurse note,Pt reports taking approximately 5 Trazodone since 2130. When asked if this was to harm self, pt responds "yes." Pt reports she is still actively having SI thoughts. Pt with hx/o mental health disorders and in-patient placement. Pt is calm and cooperative in triage.ThePt reports taking approximately 5 Trazodone since 2130. When asked if this was to harm self, pt responds "yes." Pt reports she is still actively having SI thoughts. Pt with hx/o mental health disorders and in-patient placement.The patient is calm and cooperative in triage.  The patient was seen face-to-face by this provider; the chart was reviewed and consulted with Dr. Tamala Julian on 09/21/2020 due to the patient's care. It was discussed with the EDP that the patient does meet the criteria to be admitted to the psychiatric inpatient unit. The patient continued to be matter-of-fact about her action tonight. The patient stated her behavior tonight partially stems from her taking on other people's problems. The patient disclosed she is from [place] and her parents are aware of what happened tonight (02.22.22). 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 The patient admits to having a therapist, and her primary care prescribes her medication. She reports being  prescribed Prozac 40 mg daily and has taken this medication for almost a year. The patient voiced that she does not think the medication has been as effective as she hoped. The patient discussed she had been hospitalized once before today's visit. She narrated it was last spring, and she had suicidal ideation. She expressed checking herself into the hospital before she  would do something worse. She stated she was in the hospital for a week and then partial hospitalization for three weeks. On evaluation, the patient is alert and oriented x 4, calm and cooperative, and mood-congruent with affect. The patient does not appear to be responding to internal or external stimuli. Neither is the patient presenting with any delusional thinking. The patient denies auditory or visual hallucinations. The patient denies any suicidal, homicidal, or self-harm ideations. The patient is not presenting with any psychotic or paranoid behaviors. During an encounter with the patient, she answered questions appropriately.  After the above admission evaluation, Natalyia's presenting symptoms were noted. She was recommended for mood stabilization treatments. Themedication regimen targeting those presenting symptoms were discussed & initiated with her consent. She was medicated, stabilized & discharged on the medications as listed on her discharge medication lists below. Besides the mood stabilization treatments, Gearlene was also enrolled & participated in the group counseling sessions being offered & held on this unit. She learned coping skills. She also presented other pre-existing medical issues that required treatment. She was resumed & discharged on all her pertinent home medications for those health issues. She tolerated her treatment regimen without any adverse effects or reactions reported.  Mckinna's symptoms responded well to her treatment regimen warranting this discharge. Her symptoms has subsided & mood stable. Patient has met the maximum benefit of her hospitalization. She is currently mentally & medically stable to continue mental health care & medication management on an outpatient basis as noted below. She is provided with all the necessary information needed to make this appointment without problems.  During the course of her hospitalization, the 15-minute checks were adequate to  ensure Ortha's safety.  Patient did not display any dangerous, violent or suicidal behavior on the unit.  She interacted with patients & staff appropriately, participated appropriately in the group sessions/therapies. Her medications were addressed & adjusted to meet her needs. She was recommended for outpatient follow-up care & medication management upon discharge to assure continuity of care.  At the time of discharge patient is not reporting any acute suicidal/homicidal ideations. She feels more confident about her self-care & in managing her mental health moving forward. She currently denies any new issues or concerns. Education and supportive counseling provided throughout her hospital stay & upon discharge.  Today upon her discharge evaluation with the attending psychiatrist, Chelan shares she is doing well. She denies any other specific concerns. She is sleeping well. Her appetite is good. She denies other physical complaints. She denies AH/VH, delusional thoughts or paranoia. She feels that her medications have been helpful & is in agreement to continue her current treatment regimen as recommended. She was able to engage in safety planning including plan to return to [facility] or contact emergency services if she feels unable to maintain her own safety or the safety of others. Pt had no further questions, comments, or concerns. She left [facility] with all personal belongings in no apparent distress. Transportation per her friend.  Physical Findings: AIMS: Facial and Oral Movements Muscles of Facial Expression: None, normal Lips and Perioral Area: None, normal Jaw: None, normal Tongue: None,  normal,Extremity Movements Upper (arms, wrists, hands, fingers): None, normal Lower (legs, knees, ankles, toes): None, normal, Trunk Movements Neck, shoulders, hips: None, normal, Overall Severity Severity of abnormal movements (highest score from questions above): None, normal Incapacitation due to abnormal  movements: None, normal Patient's awareness of abnormal movements (rate only patient's report): No Awareness, Dental Status Current problems with teeth and/or dentures?: No Does patient usually wear dentures?: No  CIWA:    COWS:     Musculoskeletal: Strength & Muscle Tone: within normal limits Gait & Station: normal Patient leans: N/A  Psychiatric Specialty Exam: Physical Exam Vitals and nursing note reviewed.  HENT:     Head: Normocephalic.     Nose: Nose normal.     Mouth/Throat:     Pharynx: Oropharynx is clear.  Eyes:     Pupils: Pupils are equal, round, and reactive to light.  Cardiovascular:     Rate and Rhythm: Normal rate.     Pulses: Normal pulses.  Pulmonary:     Effort: Pulmonary effort is normal.  Genitourinary:    Comments: Deferred Musculoskeletal:        General: Normal range of motion.     Cervical back: Normal range of motion.  Skin:    General: Skin is warm and dry.  Neurological:     General: No focal deficit present.     Mental Status: She is alert and oriented to person, place, and time. Mental status is at baseline.     Review of Systems  Constitutional: Negative for chills, diaphoresis and fever.  HENT: Negative for congestion, rhinorrhea, sneezing and sore throat.   Eyes: Negative for discharge.  Respiratory: Negative for cough, shortness of breath and wheezing.   Cardiovascular: Negative for chest pain and palpitations.  Gastrointestinal: Negative for diarrhea, nausea and vomiting.  Endocrine: Negative for cold intolerance.  Genitourinary: Negative for difficulty urinating.  Musculoskeletal: Negative for arthralgias and myalgias.  Skin: Negative.   Allergic/Immunologic: Negative for environmental allergies and food allergies.       Allergies: Red dye  Neurological: Negative for dizziness, tremors, seizures, syncope, facial asymmetry, speech difficulty, weakness, light-headedness, numbness and headaches.  Psychiatric/Behavioral: Positive  for dysphoric mood (Stabilized with medication prior to discharge) and sleep disturbance (Stabilized with medication prior to discharge). Negative for agitation, behavioral problems, confusion, decreased concentration, hallucinations, self-injury and suicidal ideas. The patient is not nervous/anxious (Stable upon discharge) and is not hyperactive.     Blood pressure 116/85, pulse (!) 110, temperature 98.1 F (36.7 C), temperature source Oral, resp. rate 20, height $RemoveBe'5\' 8"'MHdpGMiAR$  (1.727 m), weight 66.7 kg, SpO2 100 %.Body mass index is 22.35 kg/m.  General Appearance: See Md's discharge SRA  Sleep:  Number of Hours: 6.75   Have you used any form of tobacco in the last 30 days? (Cigarettes, Smokeless Tobacco, Cigars, and/or Pipes): No  Has this patient used any form of tobacco in the last 30 days? (Cigarettes, Smokeless Tobacco, Cigars, and/or Pipes) Yes, N/A  Blood Alcohol level:  Lab Results  Component Value Date   ETH <10 25/36/6440    Metabolic Disorder Labs:  Lab Results  Component Value Date   HGBA1C 4.5 (L) 09/24/2020   MPG 82.45 09/24/2020   No results found for: PROLACTIN Lab Results  Component Value Date   CHOL 130 09/24/2020   TRIG 117 09/24/2020   HDL 35 (L) 09/24/2020   CHOLHDL 3.7 09/24/2020   VLDL 23 09/24/2020   LDLCALC 72 09/24/2020    See Psychiatric Specialty Exam and  Suicide Risk Assessment completed by Attending Physician prior to discharge.  Discharge destination:  Home  Is patient on multiple antipsychotic therapies at discharge:  No   Has Patient had three or more failed trials of antipsychotic monotherapy by history:  No  Recommended Plan for Multiple Antipsychotic Therapies: NA  Allergies as of 09/26/2020      Reactions   Red Dye Itching, Other (See Comments)      Medication List    STOP taking these medications   acetaminophen 325 MG tablet Commonly known as: TYLENOL   triamcinolone 0.025 % ointment Commonly known as: KENALOG     TAKE these  medications     Indication  ARIPiprazole 5 MG tablet Commonly known as: ABILIFY Take 1 tablet (5 mg total) by mouth at bedtime. For mood control  Indication: Mood control   FLUoxetine 40 MG capsule Commonly known as: PROZAC Take 1 capsule (40 mg total) by mouth at bedtime. For depression What changed:   when to take this  additional instructions  Indication: Major Depressive Disorder   hydrOXYzine 25 MG tablet Commonly known as: ATARAX/VISTARIL Take 1 tablet (25 mg total) by mouth 3 (three) times daily as needed for anxiety.  Indication: Feeling Anxious   naphazoline-glycerin 0.012-0.2 % Soln Commonly known as: CLEAR EYES REDNESS Place 1-2 drops into both eyes 4 (four) times daily as needed for eye irritation.  Indication: Red Eyes   traZODone 50 MG tablet Commonly known as: DESYREL Take 1 tablet (50 mg total) by mouth at bedtime as needed for sleep. What changed: how much to take  Indication: [place] Follow up on 10/20/2020.   Why: You have an appointment on 10/20/20 at 4:00 pm.  This will be a Virtual appointment. Contact information: [place] [address] [phone]        [facility] Go on 10/04/2020.   Why: You have an appointment for therapy services on 10/04/20 at 4:00 pm.   This appointment will be held in person. Contact information: [address] [place] [address] [phone]              Follow-up recommendations: Activity:  As tolerated Diet: As recommended by your primary care doctor. Keep all scheduled follow-up appointments as recommended.   Comments: Prescriptions given at discharge.  Patient agreeable to plan.  Given opportunity to ask questions.  Appears to feel comfortable with discharge denies any current suicidal or homicidal thought. Patient is also instructed prior to discharge to: Take all medications as prescribed  by his/her mental healthcare provider. Report any adverse effects and or reactions from the medicines to his/her outpatient provider promptly. Patient has been instructed & cautioned: To not engage in alcohol and or illegal drug use while on prescription medicines. In the event of worsening symptoms, patient is instructed to call the crisis hotline, 911 and or go to the nearest ED for appropriate evaluation and treatment of symptoms. To follow-up with his/her primary care provider for your other medical issues, concerns and or health care needs.  Signed: Lindell Spar, NP, PMHNP, FNP-BC 09/26/2020, 12:04 PM

## 2020-09-26 NOTE — [facility] Group Notes (Signed)
Adult Psychoeducational Group Not Date:  09/26/2020 Time:  0277-4128 Group Topic/Focus: PROGRESSIVE RELAXATION. A group where deep breathing is taught and tensing and relaxation muscle groups is used. Imagery is used as well.  Pts are asked to imagine 3 pillars that hold them up when they are not able to hold themselves up.  Participation Level:  Active  Participation Quality:  Appropriate  Affect:  Appropriate  Cognitive:  Oriented  Insight: Improving  Engagement in Group:  Engaged  Modes of Intervention:  Activity, Discussion, Education, and Support  Additional Comments:  Rates her energy at an 8/10. States what holds her up is her friends, her family and her community.  Dione Housekeeper

## 2020-09-26 NOTE — [facility] Suicide Risk Assessment (Signed)
[facility] Discharge Suicide Risk Assessment   Principal Problem: MDD (major depressive disorder), recurrent episode, severe ([facility]) Discharge Diagnoses: Principal Problem:   MDD (major depressive disorder), recurrent episode, severe ([facility]) Active Problems:   Major depression   Total Time spent with patient: 20 minutes  Musculoskeletal: Strength & Muscle Tone: within normal limits Gait & Station: normal Patient leans: N/A  Psychiatric Specialty Exam: Review of Systems  All other systems reviewed and are negative.   Blood pressure 116/85, pulse (!) 110, temperature 98.1 F (36.7 C), temperature source Oral, resp. rate 20, height 5\' 8"  (1.727 m), weight 66.7 kg, SpO2 100 %.Body mass index is 22.35 kg/m.  General Appearance: Casual  Eye Contact::  Good  Speech:  Normal Rate409  Volume:  Normal  Mood:  Euthymic  Affect:  Congruent  Thought Process:  Coherent and Descriptions of Associations: Intact  Orientation:  Full (Time, Place, and Person)  Thought Content:  Logical  Suicidal Thoughts:  No  Homicidal Thoughts:  No  Memory:  Immediate;   Good Recent;   Good Remote;   Good  Judgement:  Intact  Insight:  Fair  Psychomotor Activity:  Normal  Concentration:  Good  Recall:  Good  Fund of Knowledge:Good  Language: Good  Akathisia:  Negative  Handed:  Right  AIMS (if indicated):     Assets:  Desire for Improvement Housing Resilience Social Support  Sleep:  Number of Hours: 6.75  Cognition: WNL  ADL's:  Intact   Mental Status Per Nursing Assessment::   On Admission:  NA  Demographic Factors:  Adolescent or young adult and Caucasian  Loss Factors: NA  Historical Factors: Impulsivity  Risk Reduction Factors:   Living with another person, especially a relative and Positive social support  Continued Clinical Symptoms:  Depression:   Impulsivity  Cognitive Features That Contribute To Risk:  None    Suicide Risk:  Minimal: No identifiable suicidal ideation.   Patients presenting with no risk factors but with morbid ruminations; may be classified as minimal risk based on the severity of the depressive symptoms   Follow-up Information    [facility] Follow up on 10/20/2020.   Why: You have an appointment on 10/20/20 at 4:00 pm.  This will be a Virtual appointment. Contact information: [address] [place] [phone]        [facility] Go on 10/04/2020.   Why: You have an appointment for therapy services on 10/04/20 at 4:00 pm.   This appointment will be held in person. Contact information: [address] [place] [phone]               Plan Of Care/Follow-up recommendations:  Activity:  ad lib  [phone], MD 09/26/2020, 8:24 AM

## 2020-09-26 NOTE — Progress Notes (Signed)
  [facility] Adult Case Management Discharge Plan :  Will you be returning to the same living situation after discharge:  Yes,  returning to [facility]. At discharge, do you have transportation home?: Yes,  transportation is being provided by a friend. Do you have the ability to pay for your medications: Yes,  patient has medical insurance.  Release of information consent forms completed and in the chart;  Patient's signature needed at discharge.  Patient to Follow up at:  Follow-up Information    [facility] Follow up on 10/20/2020.   Why: You have an appointment on 10/20/20 at 4:00 pm.  This will be a Virtual appointment. Contact information: [address] [place] [address] [phone]        [facility]. Go on 10/04/2020.   Why: You have an appointment for therapy services on 10/04/20 at 4:00 pm.   This appointment will be held in person. Contact information: [address] [place] [address] [phone]               Next level of care provider has access to [facility] Link:no  Safety Planning and Suicide Prevention discussed: Yes,  SPE completed.  Have you used any form of tobacco in the last 30 days? (Cigarettes, Smokeless Tobacco, Cigars, and/or Pipes): No  Has patient been referred to the Quitline?: N/A patient is not a smoker  Patient has been referred for addiction treatment: N/A  Evorn Gong, LCSW 09/26/2020, 11:17 AM

## 2020-09-26 NOTE — [facility] Group Notes (Signed)
[facility] LCSW Group Therapy Note  Date/Time:  09/26/2020 9:00-10:00 or 10:00-11:00AM  Type of Therapy and Topic:  Group Therapy:  Healthy and Unhealthy Supports  Participation Level:  Active   Description of Group:  Patients in this group were introduced to the idea of adding a variety of healthy supports to address the various needs in their lives.Patients discussed what additional healthy supports could be helpful in their recovery and wellness after discharge in order to prevent future hospitalizations.   An emphasis was placed on using counselor, doctor, therapy groups, 12-step groups, and problem-specific support groups to expand supports.  They also worked as a group on developing a specific plan for several patients to deal with unhealthy supports through boundary-setting, psychoeducation with loved ones, and even termination of relationships.   Therapeutic Goals:   1)  discuss importance of adding supports to stay well once out of the hospital  2)  compare healthy versus unhealthy supports and identify some examples of each  3)  generate ideas and descriptions of healthy supports that can be added  4)  offer mutual support about how to address unhealthy supports  5)  encourage active participation in and adherence to discharge plan    Summary of Patient Progress:  The patient stated that current healthy supports in her life are family while current unhealthy supports include some friends.  The patient expressed a willingness to add honesty as support(s) to help in her recovery journey.   Therapeutic Modalities:   Motivational Interviewing Brief Solution-Focused Therapy  Evorn Gong

## 2020-11-19 ENCOUNTER — Ambulatory Visit
Admission: RE | Admit: 2020-11-19 | Discharge: 2020-11-19 | Disposition: A | Discharge status: Home / Self Care | Payer: 59 | Source: Ambulatory Visit

## 2020-11-19 ENCOUNTER — Other Ambulatory Visit: Payer: Self-pay

## 2020-11-19 VITALS — BP 133/85 | HR 76 | Temp 97.9°F | Resp 16 | Wt 160.0 lb

## 2020-11-19 DIAGNOSIS — R059 Cough, unspecified: Secondary | ICD-10-CM | POA: Diagnosis not present

## 2020-11-19 DIAGNOSIS — J302 Other seasonal allergic rhinitis: Secondary | ICD-10-CM | POA: Diagnosis not present

## 2020-11-19 HISTORY — DX: Poisoning by unspecified drugs, medicaments and biological substances, accidental (unintentional), initial encounter: T50.901A

## 2020-11-19 MED ORDER — BENZONATATE 100 MG PO CAPS: 100.0000 mg | ORAL_CAPSULE | Freq: Three times a day (TID) | ORAL | 0 refills | Status: AC | PRN

## 2020-11-19 NOTE — ED Triage Notes (Signed)
Patient presents to Urgent Care with complaints of productive cough x 3 weeks. She states it is worse with activity. Hx of sinus infections and allergies. Treating symptoms with otc allergy meds.   Denies fever, n/v, or diarrhea.

## 2020-11-19 NOTE — ED Provider Notes (Signed)
Cathy Garner    CSN: [id number] Arrival date & time: 11/19/20  1237      History   Chief Complaint Chief Complaint  Patient presents with  . Cough    X 3 weeks     HPI Cathy Garner is a 20 y.o. female.   Patient presents with 3-week history of productive cough.  She reports minor sore throat when she is coughing; none currently.  She denies fever, chills, rash, shortness of breath, vomiting, diarrhea, or other symptoms.  Treatment attempted at home with generic allergy medication; patient does not know the name.  She states her symptoms are similar to her usual seasonal allergies.  Her medical history also includes depression, anxiety, overdose.  The history is provided by the patient.    Past Medical History:  Diagnosis Date  . Anxiety   . Depression   . Overdose     Patient Active Problem List   Diagnosis Date Noted  . Major depression 09/22/2020  . MDD (major depressive disorder), recurrent episode, severe (HCC) 09/21/2020  . Overdose of antidepressant 09/21/2020    Past Surgical History:  Procedure Laterality Date  . APPENDECTOMY    . WISDOM TOOTH EXTRACTION      OB History   No obstetric history on file.      Home Medications    Prior to Admission medications   Medication Sig Start Date End Date Taking? Authorizing Provider  benzonatate (TESSALON) 100 MG capsule Take 1 capsule (100 mg total) by mouth 3 (three) times daily as needed for cough. 11/19/20  Yes Mickie Bail, NP  amphetamine-dextroamphetamine (ADDERALL XR) 5 MG 24 hr capsule Take 5 mg by mouth daily. 11/11/20   [provider]  ARIPiprazole (ABILIFY) 5 MG tablet Take 1 tablet (5 mg total) by mouth at bedtime. For mood control 09/26/20   Armandina Stammer I, NP  FLUoxetine (PROZAC) 40 MG capsule Take 1 capsule (40 mg total) by mouth at bedtime. For depression 09/26/20   Armandina Stammer I, NP  hydrOXYzine (ATARAX/VISTARIL) 25 MG tablet Take 1 tablet (25 mg total) by mouth 3 (three)  times daily as needed for anxiety. 09/26/20   Armandina Stammer I, NP  lamoTRIgine (LAMICTAL) 25 MG tablet Take 25 mg by mouth at bedtime. 10/01/20   [provider]  naphazoline-glycerin (CLEAR EYES REDNESS) 0.012-0.2 % SOLN Place 1-2 drops into both eyes 4 (four) times daily as needed for eye irritation. 09/26/20   Armandina Stammer I, NP  traZODone (DESYREL) 50 MG tablet Take 1 tablet (50 mg total) by mouth at bedtime as needed for sleep. 09/26/20   Sanjuana Kava, NP    Family History Family History  Problem Relation Age of Onset  . Healthy Mother   . Healthy Father     Social History Social History   Tobacco Use  . Smoking status: Never Smoker  . Smokeless tobacco: Never Used  Vaping Use  . Vaping Use: Never used  Substance Use Topics  . Alcohol use: Yes    Comment: Occasional Drinker  . Drug use: Yes    Types: Marijuana     Allergies   Red dye   Review of Systems Review of Systems  Constitutional: Negative for chills and fever.  HENT: Positive for sore throat. Negative for ear pain.   Eyes: Negative for pain and visual disturbance.  Respiratory: Positive for cough. Negative for shortness of breath.   Cardiovascular: Negative for chest pain and palpitations.  Gastrointestinal: Negative for abdominal  pain, diarrhea and vomiting.  Genitourinary: Negative for dysuria and hematuria.  Musculoskeletal: Negative for arthralgias and back pain.  Skin: Negative for color change and rash.  Neurological: Negative for seizures and syncope.  All other systems reviewed and are negative.    Physical Exam Triage Vital Signs ED Triage Vitals [11/19/20 1248]  Enc Vitals Group     BP      Pulse      Resp      Temp      Temp src      SpO2      Weight 160 lb (72.6 kg)     Height      Head Circumference      Peak Flow      Pain Score 0     Pain Loc      Pain Edu?      Excl. in GC?    No data found.  Updated Vital Signs BP 133/85 (BP Location: Left Arm)   Pulse 76    Temp 97.9 F (36.6 C) (Temporal)   Resp 16   Wt 160 lb (72.6 kg)   LMP 11/19/2020   SpO2 96%   BMI 24.33 kg/m   Visual Acuity Right Eye Distance:   Left Eye Distance:   Bilateral Distance:    Right Eye Near:   Left Eye Near:    Bilateral Near:     Physical Exam Vitals and nursing note reviewed.  Constitutional:      General: She is not in acute distress.    Appearance: She is well-developed. She is not ill-appearing.  HENT:     Head: Normocephalic and atraumatic.     Right Ear: Tympanic membrane normal.     Left Ear: Tympanic membrane normal.     Nose: Nose normal.     Mouth/Throat:     Mouth: Mucous membranes are moist.     Pharynx: Oropharynx is clear.  Eyes:     Conjunctiva/sclera: Conjunctivae normal.  Cardiovascular:     Rate and Rhythm: Normal rate and regular rhythm.     Heart sounds: Normal heart sounds.  Pulmonary:     Effort: Pulmonary effort is normal. No respiratory distress.     Breath sounds: Normal breath sounds. No wheezing.  Abdominal:     Palpations: Abdomen is soft.     Tenderness: There is no abdominal tenderness.  Musculoskeletal:     Cervical back: Neck supple.  Skin:    General: Skin is warm and dry.     Findings: No rash.  Neurological:     General: No focal deficit present.     Mental Status: She is alert and oriented to person, place, and time.     Gait: Gait normal.  Psychiatric:        Mood and Affect: Mood normal.        Behavior: Behavior normal.      UC Treatments / Results  Labs (all labs ordered are listed, but only abnormal results are displayed) Labs Reviewed - No data to display  EKG   Radiology No results found.  Procedures Procedures (including critical care time)  Medications Ordered in UC Medications - No data to display  Initial Impression / Assessment and Plan / UC Course  I have reviewed the triage vital signs and the nursing notes.  Pertinent labs & imaging results that were available during my  care of the patient were reviewed by me and considered in my medical decision making (see chart  for details).   Seasonal allergies, cough.  Patient is well-appearing and her exam is reassuring.  She declines COVID test.  Treating cough with Tessalon Perles.  Discussed symptomatic treatment for seasonal allergies, including Zyrtec or Xyzal.  Instructed patient to follow-up with her PCP if her symptoms are not improving.  She agrees to plan of care.   Final Clinical Impressions(s) / UC Diagnoses   Final diagnoses:  Seasonal allergies  Cough     Discharge Instructions     Take the Tessalon Perles as needed for cough.    Take over-the-counter Zyrtec or Xyzal for your allergy symptoms.    Follow up with your primary care provider if your symptoms are not improving.         ED Prescriptions    Medication Sig Dispense Auth. Provider   benzonatate (TESSALON) 100 MG capsule Take 1 capsule (100 mg total) by mouth 3 (three) times daily as needed for cough. 21 capsule Mickie Bail, NP     PDMP not reviewed this encounter.   Mickie Bail, NP 11/19/20 1310

## 2020-11-19 NOTE — Discharge Instructions (Signed)
Take the [facility] as needed for cough.    Take over-the-counter Zyrtec or Xyzal for your allergy symptoms.    Follow up with your primary care provider if your symptoms are not improving.

## 2021-10-03 IMAGING — DX DG CHEST 1V PORT
1 series · 2 of 2 positions shown · non-contrast
Comparison: None.

CLINICAL DATA: Fever, weakness and body aches.

EXAM:
PORTABLE CHEST 1 VIEW

[Series 1: chest ap · 0.14mm/px · 2 of 2 slices shown]
[im 1/2]
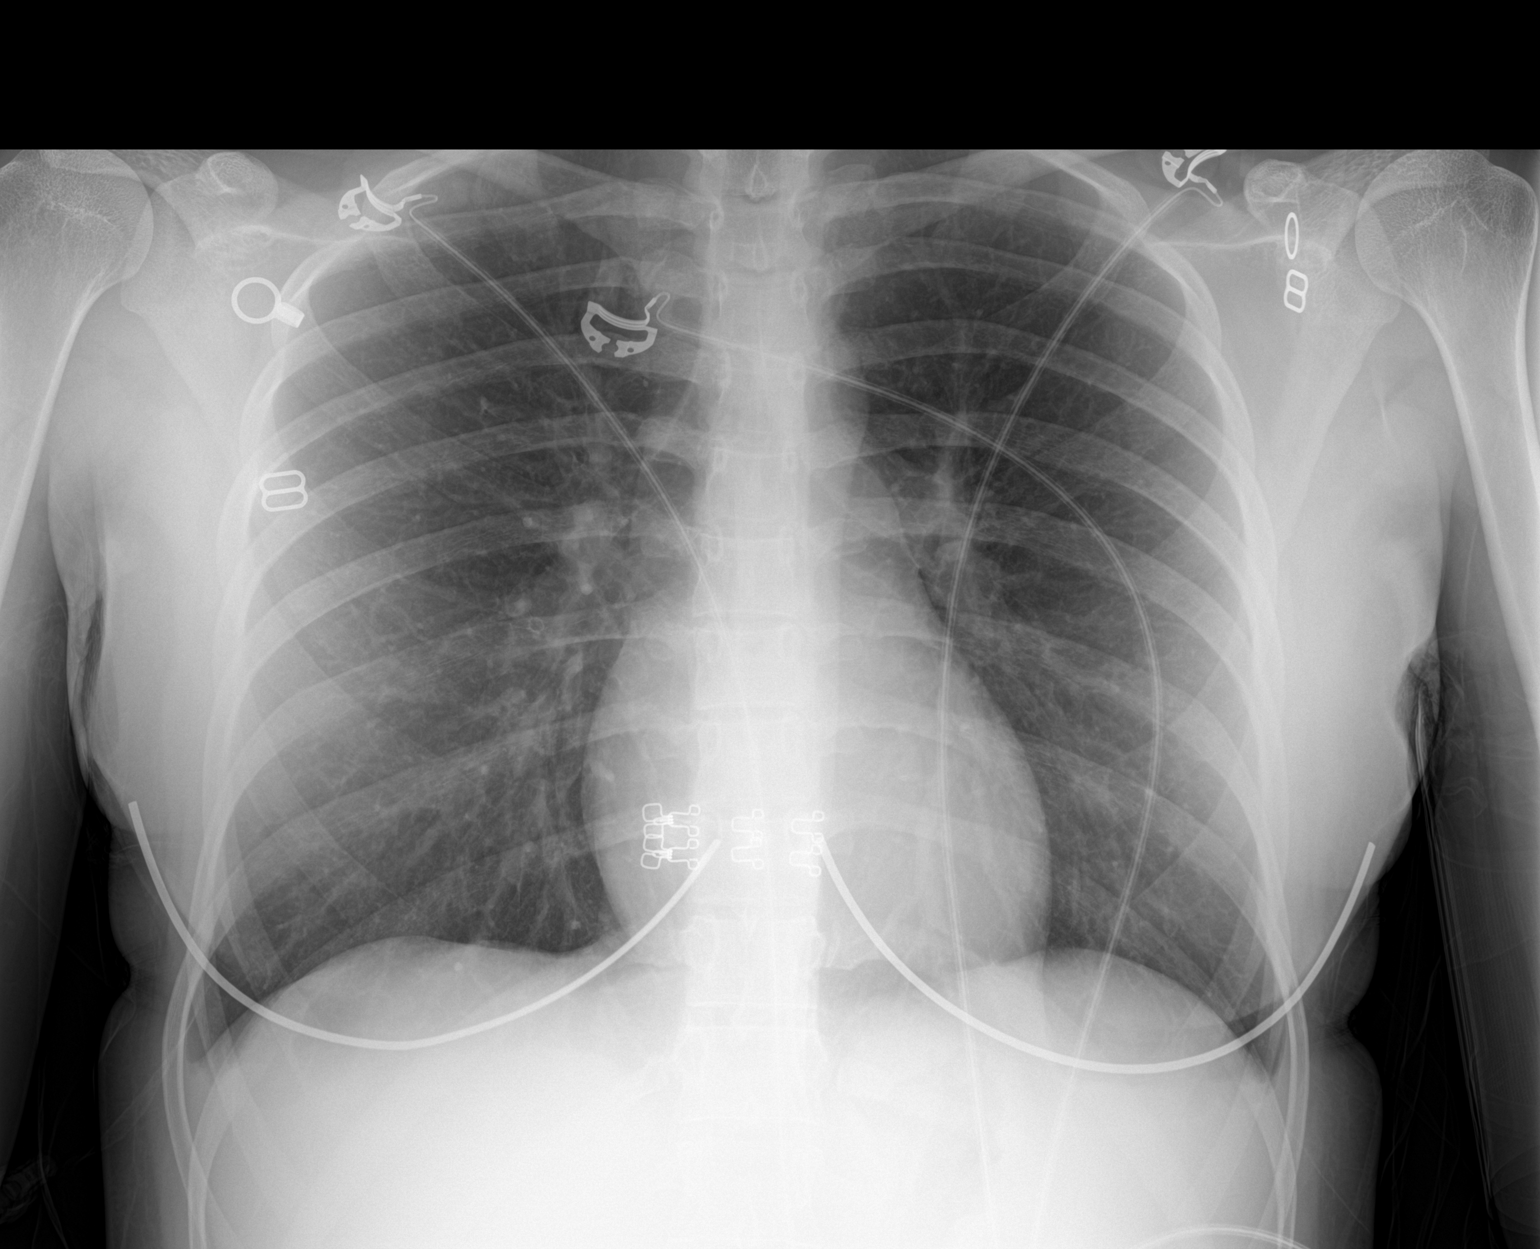
[im 2/2]
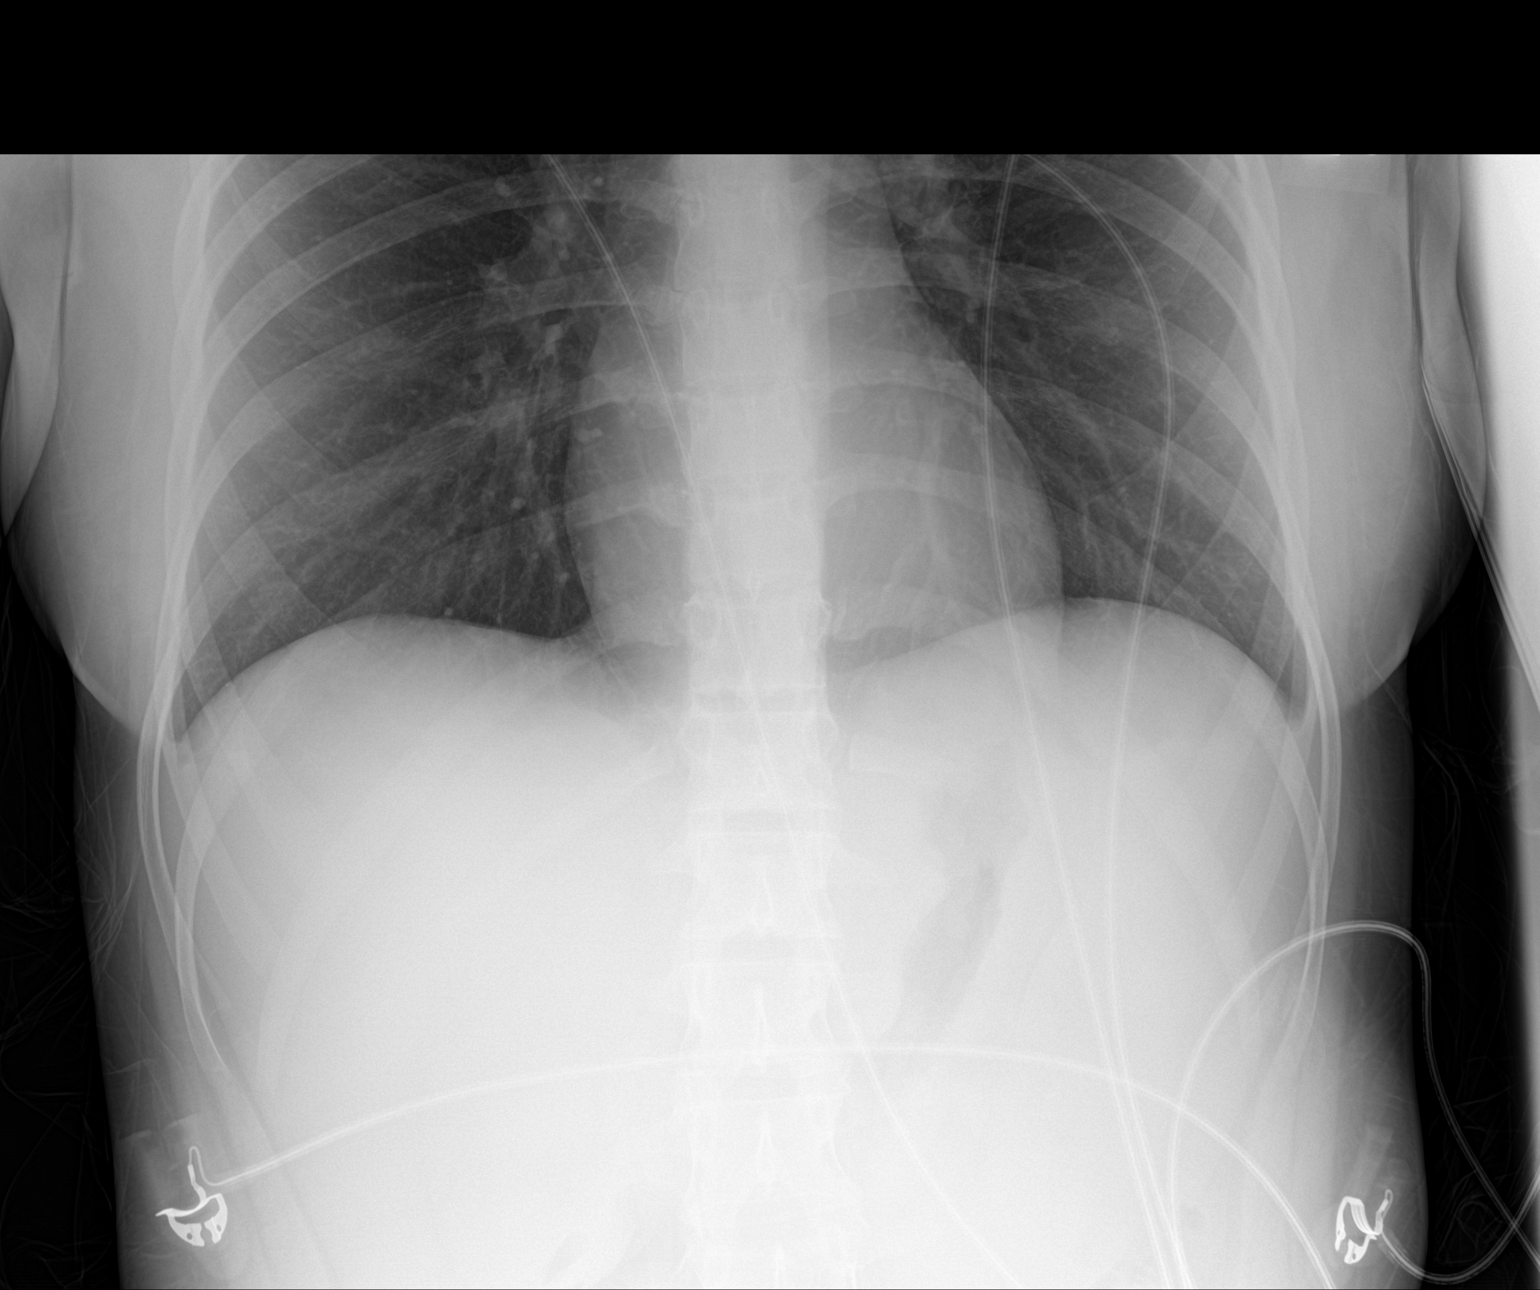

[2 of 2 positions shown; findings below may reference images not displayed]

FINDINGS: The heart size and mediastinal contours are within normal limits.
Both lungs are clear. The visualized skeletal structures are
unremarkable.
IMPRESSION: No active disease.

## 2021-10-04 IMAGING — CT CT ABD-PELV W/ CM
2 of 4 series · 16 of 46 positions shown, 18 images · IV contrast (omnipaque)
Comparison: None.

CLINICAL DATA: Unspecified abdominal pain, myalgia, fever

EXAM:
CT ABDOMEN AND PELVIS WITH CONTRAST
TECHNIQUE: Multidetector CT imaging of the abdomen and pelvis was performed
using the standard protocol following bolus administration of
intravenous contrast.
CONTRAST:  100mL OMNIPAQUE IOHEXOL 300 MG/ML  SOLN

[Series 2: axial st · axial · 0.70mm/px · z∈[+953,+1378]mm · 13 of 96 slices shown, 15 images]
[im 6/96  soft-tissue]
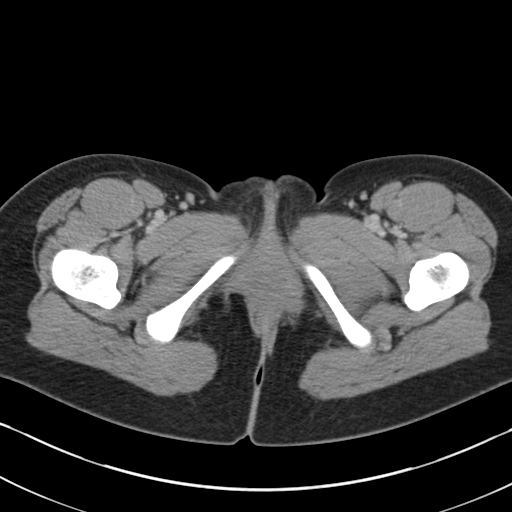
[im 6/96  bone]
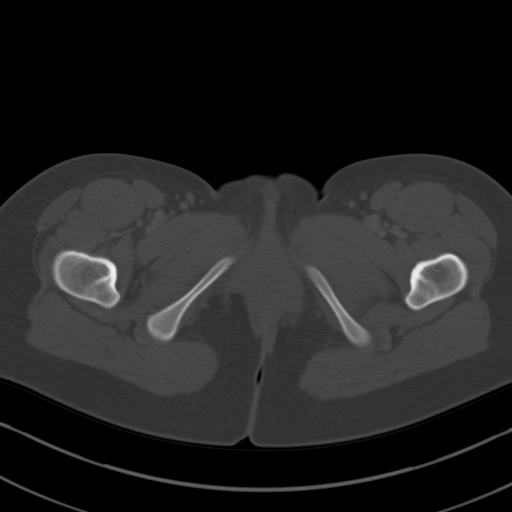
[im 16/96  soft-tissue]
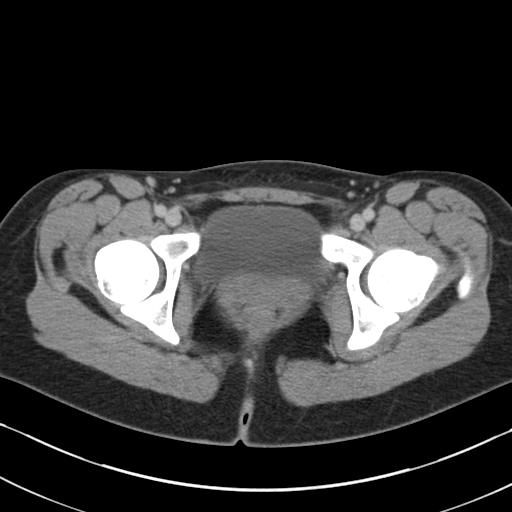
[im 21/96  soft-tissue]
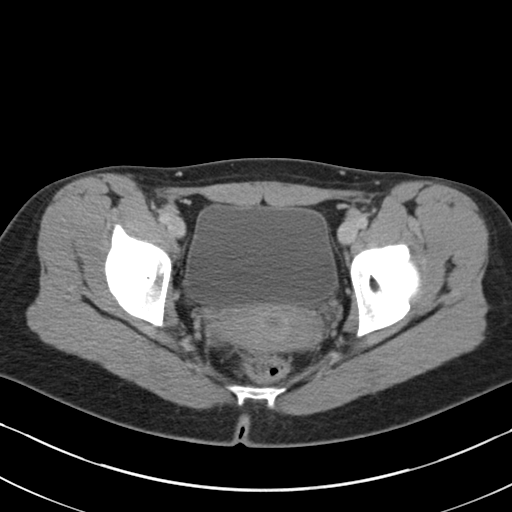
[im 26/96  soft-tissue]
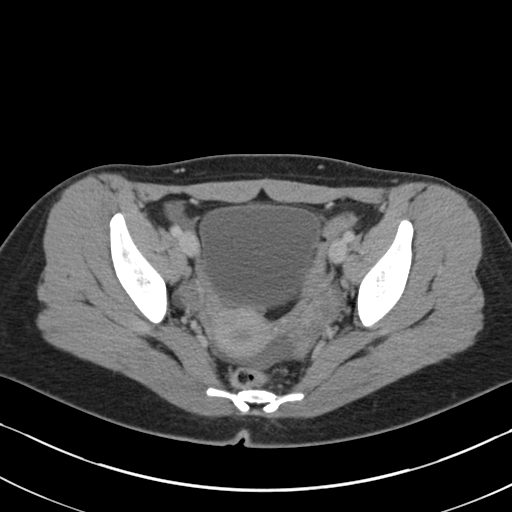
[im 36/96  soft-tissue]
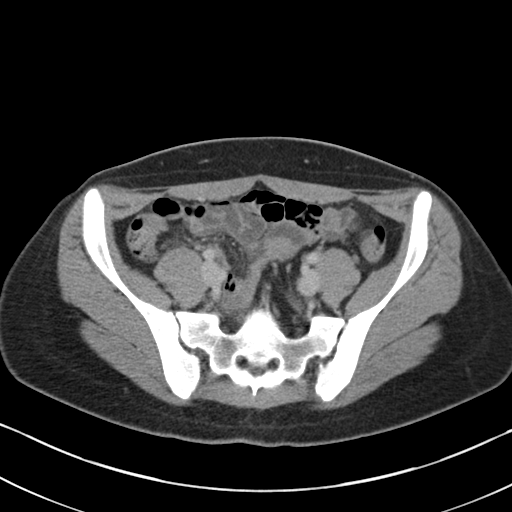
[im 41/96  soft-tissue]
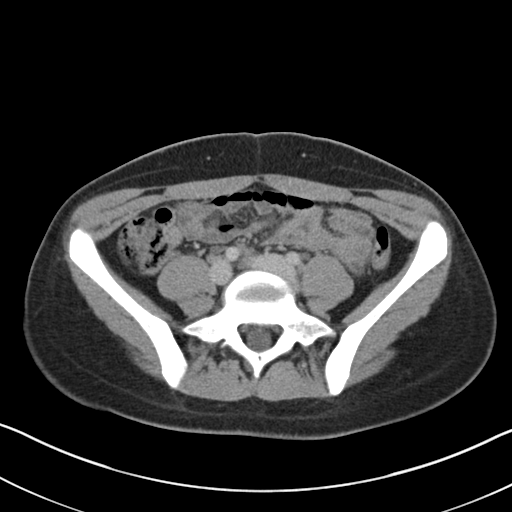
[im 51/96  soft-tissue]
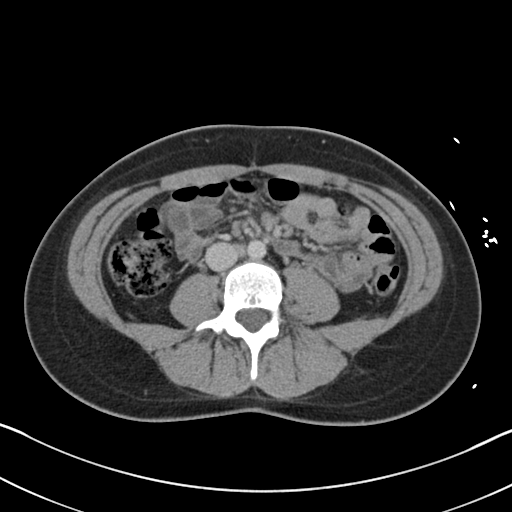
[im 56/96  soft-tissue]
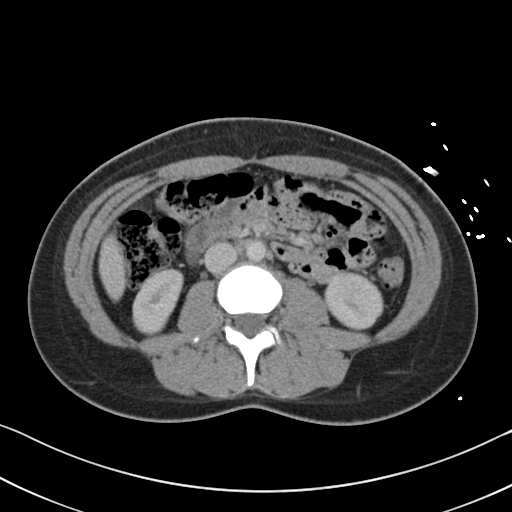
[im 61/96  soft-tissue]
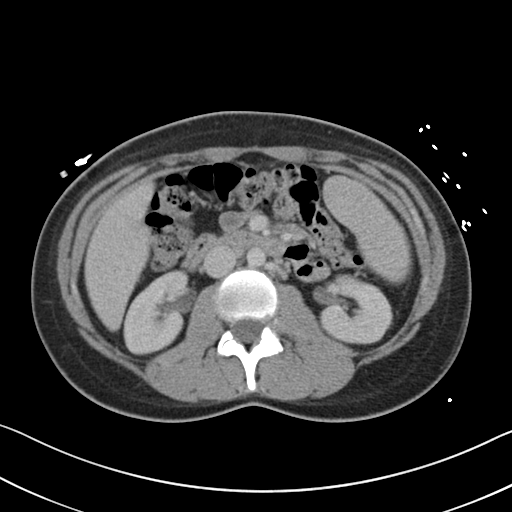
[im 61/96  bone]
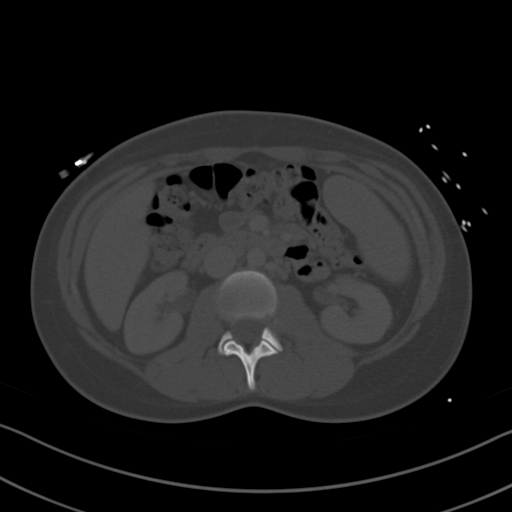
[im 71/96  soft-tissue]
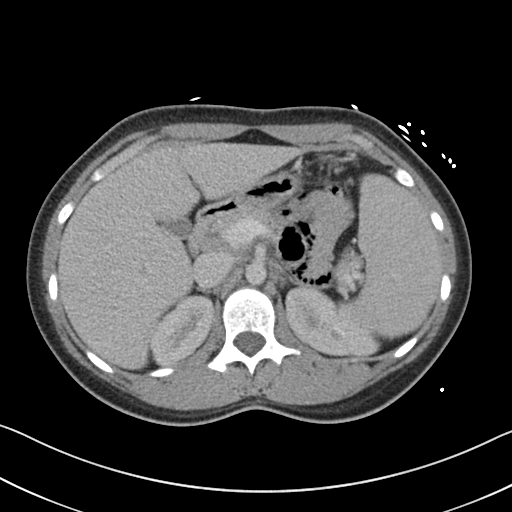
[im 76/96  soft-tissue]
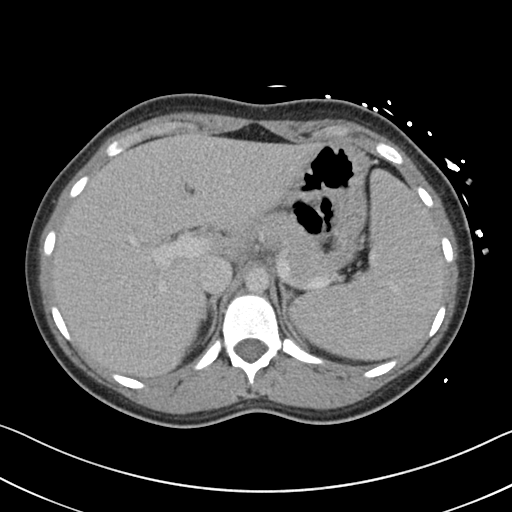
[im 81/96  soft-tissue]
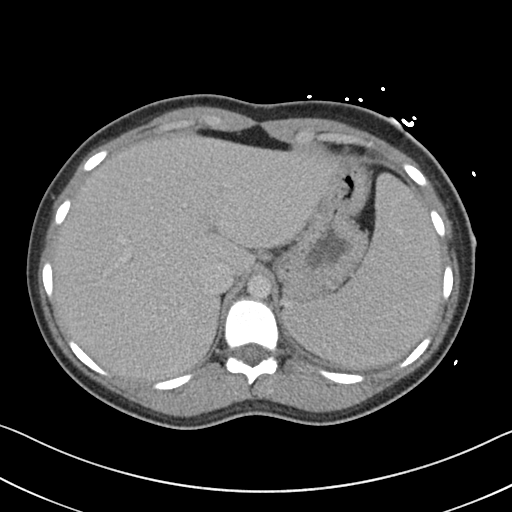
[im 91/96  soft-tissue]
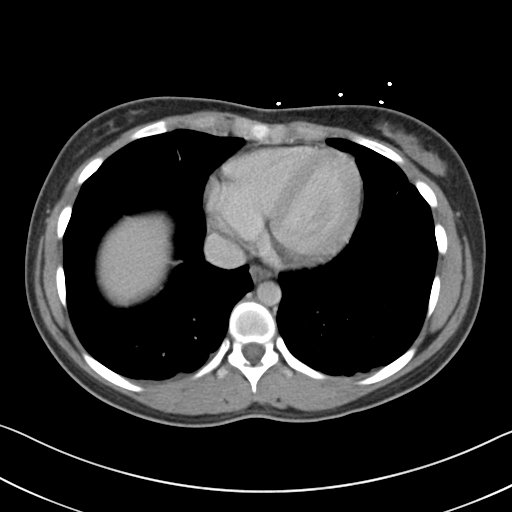

[Series 4: coronal st · coronal · 0.78mm/px · 3 of 123 slices shown]
[im 41/123  soft-tissue]
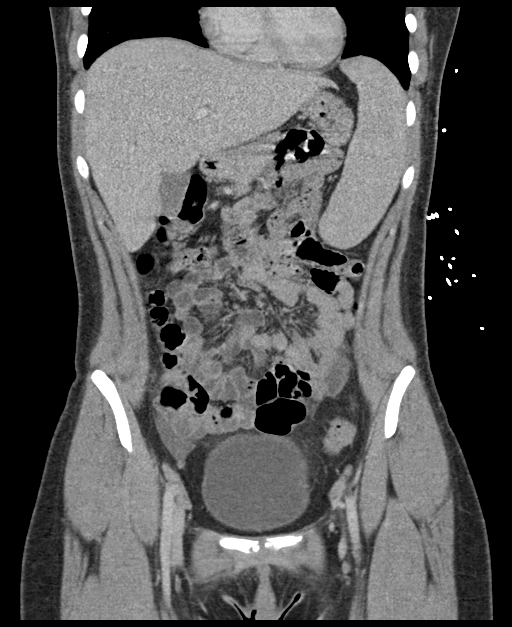
[im 55/123  soft-tissue]
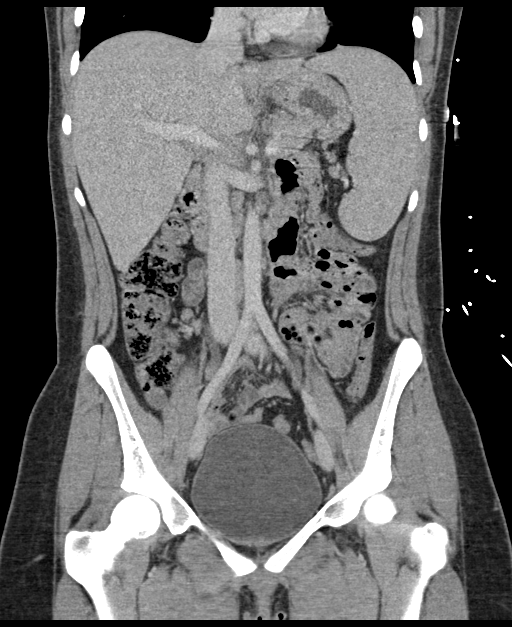
[im 68/123  soft-tissue]
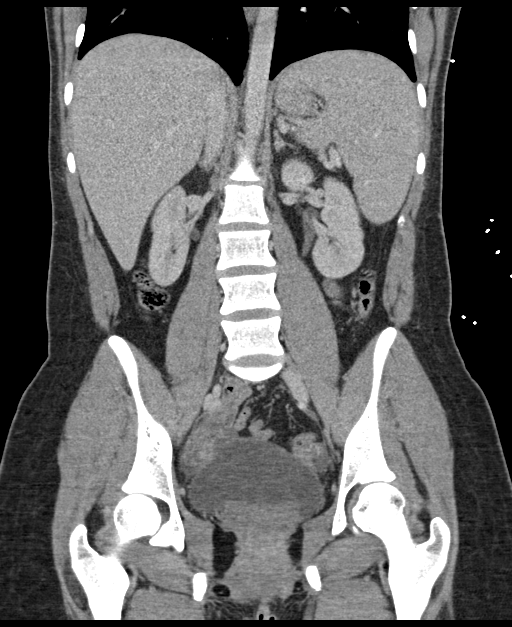

[16 of 46 positions shown; findings below may reference images not displayed]

FINDINGS: Lower chest: No acute abnormality.

Hepatobiliary: No focal liver abnormality is seen. No gallstones,
gallbladder wall thickening, or biliary dilatation.

Pancreas: Unremarkable. No pancreatic ductal dilatation or
surrounding inflammatory changes.

Spleen: There is mild splenomegaly with the spleen measuring 14.7 cm
in greatest dimension. No intrasplenic lesions are seen. The splenic
vein is patent.

Adrenals/Urinary Tract: Adrenal glands are unremarkable. Kidneys are
normal, without renal calculi, focal lesion, or hydronephrosis.
Bladder is unremarkable.

Stomach/Bowel: The stomach, small bowel, and large bowel are
unremarkable. Appendix absent. Mild free fluid within the pelvis is
nonspecific, possibly physiologic in a female patient of this age.

Vascular/Lymphatic: Circumaortic left renal vein. Abdominal
vasculature is otherwise unremarkable. There is shotty mesenteric,
left periaortic, and aortocaval lymphadenopathy without frankly
pathologic enlargement.

Reproductive: Uterus and bilateral adnexa are unremarkable.

Other: Rectum unremarkable

Musculoskeletal: No lytic or blastic bone lesions are seen.
IMPRESSION: Shotty mesenteric adenopathy and mild splenomegaly. These can be
seen in the setting of lymphoproliferative disorders, granulomatous
inflammation, autoimmune disorder or systemic infection such as
mononucleosis.

## 2022-05-08 ENCOUNTER — Encounter: Payer: Self-pay | Admitting: Oncology

## 2022-05-08 ENCOUNTER — Ambulatory Visit (INDEPENDENT_AMBULATORY_CARE_PROVIDER_SITE_OTHER): Payer: No Typology Code available for payment source | Admitting: Oncology

## 2022-05-08 ENCOUNTER — Other Ambulatory Visit: Payer: Self-pay

## 2022-05-08 VITALS — BP 118/64 | HR 102 | Temp 97.9°F | Resp 18 | Ht 68.0 in | Wt 162.0 lb

## 2022-05-08 DIAGNOSIS — J029 Acute pharyngitis, unspecified: Secondary | ICD-10-CM | POA: Diagnosis not present

## 2022-05-08 MED ORDER — AMOXICILLIN-POT CLAVULANATE 875-125 MG PO TABS
1.0000 | ORAL_TABLET | Freq: Two times a day (BID) | ORAL | 0 refills | Status: AC
Start: 2022-05-08 — End: ?

## 2022-05-08 MED ORDER — PREDNISONE 20 MG PO TABS: 40.0000 mg | ORAL_TABLET | Freq: Every day | ORAL | 0 refills | Status: AC

## 2022-05-08 NOTE — Progress Notes (Signed)
[facility] 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 student here for persistent sore throat, sinus pressure, phlegm production and ear discomfort that started after she had COVID a few weeks ago.  States she has a chronic tonsillitis and has an appointment for possible removal in the next couple of weeks.  Has been taking DayQuil and ibuprofen for comfort.  Gets sinus and tonsillitis frequently.  [place]aving to go to [place] with [place] boyfriend on Wednesday.  Current Medication:  Outpatient Encounter Medications as of 05/08/2022  Medication Sig   amphetamine-dextroamphetamine (ADDERALL XR) 5 MG 24 hr capsule Take 5 mg by mouth daily.   ARIPiprazole (ABILIFY) 5 MG tablet Take 1 tablet (5 mg total) by mouth at bedtime. For mood control   benzonatate (TESSALON) 100 MG capsule Take 1 capsule (100 mg total) by mouth 3 (three) times daily as needed for cough.   FLUoxetine (PROZAC) 40 MG capsule Take 1 capsule (40 mg total) by mouth at bedtime. For depression   hydrOXYzine (ATARAX/VISTARIL) 25 MG tablet Take 1 tablet (25 mg total) by mouth 3 (three) times daily as needed for anxiety.   lamoTRIgine (LAMICTAL) 25 MG tablet Take 25 mg by mouth at bedtime.   naphazoline-glycerin (CLEAR EYES REDNESS) 0.012-0.2 % SOLN Place 1-2 drops into both eyes 4 (four) times daily as needed for eye irritation.   traZODone (DESYREL) 50 MG tablet Take 1 tablet (50 mg total) by mouth at bedtime as needed for sleep.   No facility-administered encounter medications on file as of 05/08/2022.      Medical History: Past Medical History:  Diagnosis Date   Anxiety    Depression    Overdose      Vital Signs: BP 118/64   Pulse (!) 102   Temp 97.9  F (36.6 C) (Tympanic)   Resp 18   Ht 5\' 8"  (1.727 m)   Wt 162 lb (73.5 kg)   SpO2 99%   BMI 24.63 kg/m   ROS: As per HPI.  All other pertinent ROS negative.     Review of Systems  Constitutional:  Positive for fatigue.  HENT:  Positive for congestion, ear pain, postnasal drip, rhinorrhea, sinus pain, sore throat and trouble swallowing.   Respiratory:  Positive for cough. Negative for shortness of breath.   Gastrointestinal:  Positive for nausea. Negative for diarrhea and vomiting.  Musculoskeletal:  Negative for myalgias.  Neurological:  Positive for headaches.    Physical Exam Vitals reviewed.  Constitutional:      Appearance: She is well-developed.  HENT:     Nose: Nasal tenderness, mucosal edema, congestion and rhinorrhea present.     Right Turbinates: Swollen.     Left Turbinates: Swollen.     Right Sinus: No maxillary sinus tenderness or frontal sinus tenderness.     Left Sinus: No maxillary sinus tenderness or frontal sinus tenderness.     Mouth/Throat:     Pharynx: Pharyngeal swelling and posterior oropharyngeal erythema present.     Tonsils: Tonsillar exudate present. 3+ on the right. 3+ on the left.  Cardiovascular:     Rate and Rhythm: Normal rate.  Neurological:     Mental Status: She is  alert.     No results found for this or any previous visit (from the past 24 hour(s)).  Assessment/Plan: 1. Sore throat -Exam is concerning for tonsillitis.  Recommend treating with Augmentin given symptoms have been present for greater than 2 weeks.  Discussed when and how to take.  Provided education in clinic and via [facility].  Also recommend steroids 40 mg in the morning x4 days to help with swelling.  Continue antihistamine and decongestant daily.  Can add Flonase 2 sprays each nostril daily. -Please let me know if your symptoms do not improve.  - amoxicillin-clavulanate (AUGMENTIN) 875-125 MG tablet; Take 1 tablet by mouth 2 (two) times daily.  Dispense: 20 tablet;  Refill: 0 - predniSONE (DELTASONE) 20 MG tablet; Take 2 tablets (40 mg total) by mouth daily with breakfast.  Dispense: 8 tablet; Refill: 0   Disposition-return to clinic if no improvement.  General Counseling: Cathy Garner verbalizes understanding of the findings of todays visit and agrees with plan of treatment. I have discussed any further diagnostic evaluation that may be needed or ordered today. We also reviewed her medications today. she has been encouraged to call the office with any questions or concerns that should arise related to todays visit.   No orders of the defined types were placed in this encounter.   No orders of the defined types were placed in this encounter.   I spent 20 minutes dedicated to the care of this patient (face-to-face and non-face-to-face) on the date of the encounter to include what is described in the assessment and plan.   Faythe Casa, NP 05/08/2022 10:56 AM

## 2023-07-01 ENCOUNTER — Other Ambulatory Visit: Payer: Self-pay

## 2023-07-01 DIAGNOSIS — W448XXA Other foreign body entering into or through a natural orifice, initial encounter: Secondary | ICD-10-CM | POA: Insufficient documentation

## 2023-07-01 DIAGNOSIS — T161XXA Foreign body in right ear, initial encounter: Secondary | ICD-10-CM | POA: Insufficient documentation

## 2023-07-01 NOTE — ED Triage Notes (Signed)
Pt reports getting her piercing in her cartilage snagged and now stud is embedded in her ear. Pt has dried blood noted around piercing.

## 2023-07-02 ENCOUNTER — Emergency Department
Admission: EM | Admit: 2023-07-02 | Discharge: 2023-07-02 | Disposition: A | Discharge status: Home / Self Care | Payer: 59 | Attending: Emergency Medicine | Admitting: Emergency Medicine

## 2023-07-02 DIAGNOSIS — T161XXA Foreign body in right ear, initial encounter: Secondary | ICD-10-CM

## 2023-07-02 MED ORDER — HYDROCODONE-ACETAMINOPHEN 5-325 MG PO TABS
1.0000 | ORAL_TABLET | Freq: Once | ORAL | Status: AC
  Administered 2023-07-02: 1 via ORAL
  Filled 2023-07-02: qty 1

## 2023-07-02 MED ORDER — LIDOCAINE HCL (PF) 1 % IJ SOLN
10.0000 mL | Freq: Once | INTRAMUSCULAR | Status: AC
  Filled 2023-07-02: qty 10

## 2023-07-02 MED ORDER — LIDOCAINE HCL (PF) 1 % IJ SOLN
INTRAMUSCULAR | Status: AC
  Administered 2023-07-02: 10 mL
  Filled 2023-07-02: qty 5

## 2023-07-02 NOTE — ED Provider Notes (Signed)
[facility] Provider Note    Event Date/Time   First MD Initiated Contact with Patient 07/02/23 [phone]     (approximate)   History   Foreign Body in Ear   HPI  Cathy Garner is a 22 year old female presenting to the emergency department for evaluation of foreign body in ear.  Patient had a piercing in her right cartilage.  The piercing got snagged and something pulling her stud into her ear and she has been unable to remove it.  Denies injury to other areas.    Physical Exam   Triage Vital Signs: ED Triage Vitals  Encounter Vitals Group     BP 07/01/23 2238 (!) 143/95     Systolic BP Percentile --      Diastolic BP Percentile --      Pulse Rate 07/01/23 2238 86     Resp 07/01/23 2238 18     Temp 07/01/23 2238 98.6 F (37 C)     Temp Source 07/01/23 2238 Oral     SpO2 07/01/23 2238 100 %     Weight 07/01/23 2237 160 lb (72.6 kg)     Height 07/01/23 2237 5\' 9"  (1.753 m)     Head Circumference --      Peak Flow --      Pain Score 07/01/23 2237 5     Pain Loc --      Pain Education --      Exclude from Growth Chart --     Most recent vital signs: Vitals:   07/01/23 2238  BP: (!) 143/95  Pulse: 86  Resp: 18  Temp: 98.6 F (37 C)  SpO2: 100%     General: Awake, interactive  CV:  Regular rate, good peripheral perfusion.  Resp:  Unlabored respirations.  Abd:  Nondistended.  Neuro:  Symmetric facial movement, fluid speech Ear:  There is an earring over the upper portion of the cartilage of the right ear.  The back of the piercing is visible, but the front of the piercing is lodged underneath the skin.  Patient has a picture of the piercing which consists of 3 stones pointed in different directions.   ED Results / Procedures / Treatments   Labs (all labs ordered are listed, but only abnormal results are displayed) Labs Reviewed - No data to display   EKG EKG independently reviewed interpreted by myself (ER attending) demonstrates:     RADIOLOGY Imaging independently reviewed and interpreted by myself demonstrates:    PROCEDURES:  Critical Care performed: No  .Foreign Body Removal  Date/Time: 07/02/2023 2:47 AM  Performed by: Trinna Post, MD Authorized by: Trinna Post, MD  Consent: Verbal consent obtained. Risks and benefits: risks, benefits and alternatives were discussed Consent given by: patient Body area: ear Location details: right ear Anesthesia: nerve block  Anesthesia: Local Anesthetic: lidocaine 1% without epinephrine Anesthetic total: 9 mL  Sedation: Patient sedated: no  Patient cooperative: yes Localization method: visualized Removal mechanism: forceps Complexity: complex 1 objects recovered. Objects recovered: 1 earring Post-procedure assessment: foreign body removed Patient tolerance: patient tolerated the procedure well with no immediate complications Comments: Auricular block performed of the right ear.  Using shears, the back of the earring was cut off.  The three-pronged earring was embedded within the ear and unable to be removed from the existing piercing.  2 small incisions were made to extend the piercing after which the patient's earring was able to be removed.  Bleeding was controlled with direct pressure  and Surgicel.  Dressing applied.  Self care instructions provided.     MEDICATIONS ORDERED IN ED: Medications  lidocaine (PF) (XYLOCAINE) 1 % injection 10 mL (10 mLs Infiltration Given by Other 07/02/23 0239)  HYDROcodone-acetaminophen (NORCO/VICODIN) 5-325 MG per tablet 1 tablet (1 tablet Oral Given 07/02/23 0242)     IMPRESSION / MDM / ASSESSMENT AND PLAN / ED COURSE  I reviewed the triage vital signs and the nursing notes.  Differential diagnosis includes, but is not limited to, foreign body in the ear without evidence of infection  Patient's presentation is most consistent with acute, uncomplicated illness.  22 year old female presenting with earring lodged into her  cartilage. Auricular block performed.  Earring was able to be removed in its entirety.  See procedure note for details.  Patient tolerated procedure well.  She was given pain medicine and supportive care instructions.  Strict return precautions provided.  Patient discharged stable condition.     FINAL CLINICAL IMPRESSION(S) / ED DIAGNOSES   Final diagnoses:  Ear foreign body, right, initial encounter     Rx / DC Orders   ED Discharge Orders     None        Note:  This document was prepared using Dragon voice recognition software and may include unintentional dictation errors.   Trinna Post, MD 07/02/23 [phone]

## 2023-07-02 NOTE — Discharge Instructions (Signed)
You were seen in the emergency department today for your earring lodged in your ear.  Glad that we were able to get it out.  Keep the area clean and dry. You can shower, but dry the area off after.  You can use topical bacitracin that you can buy over-the-counter to help the area heal.  You can take Tylenol and ibuprofen to help with pain.  Return to the ER for new or worsening symptoms.
